# Patient Record
Sex: Female | Born: 1995 | Race: White | Hispanic: No | Marital: Single | Smoking: Former smoker
Health system: Southern US, Community
[De-identification: ages and names within clinical notes are randomized; demographics above are authoritative.]

## PROBLEM LIST (undated history)

## (undated) DIAGNOSIS — Z973 Presence of spectacles and contact lenses: Secondary | ICD-10-CM

## (undated) DIAGNOSIS — L709 Acne, unspecified: Secondary | ICD-10-CM

## (undated) DIAGNOSIS — N803 Endometriosis of pelvic peritoneum, unspecified: Secondary | ICD-10-CM

## (undated) DIAGNOSIS — F329 Major depressive disorder, single episode, unspecified: Secondary | ICD-10-CM

## (undated) DIAGNOSIS — F411 Generalized anxiety disorder: Secondary | ICD-10-CM

## (undated) DIAGNOSIS — N83201 Unspecified ovarian cyst, right side: Secondary | ICD-10-CM

## (undated) DIAGNOSIS — J302 Other seasonal allergic rhinitis: Secondary | ICD-10-CM

## (undated) DIAGNOSIS — F32A Depression, unspecified: Secondary | ICD-10-CM

## (undated) DIAGNOSIS — Z9889 Other specified postprocedural states: Secondary | ICD-10-CM

## (undated) DIAGNOSIS — F419 Anxiety disorder, unspecified: Secondary | ICD-10-CM

## (undated) DIAGNOSIS — F5000 Anorexia nervosa, unspecified: Secondary | ICD-10-CM

## (undated) HISTORY — DX: Major depressive disorder, single episode, unspecified: F32.9

## (undated) HISTORY — DX: Depression, unspecified: F32.A

## (undated) HISTORY — DX: Anxiety disorder, unspecified: F41.9

---
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
## 2015-04-12 ENCOUNTER — Other Ambulatory Visit (HOSPITAL_COMMUNITY): Payer: Self-pay | Admitting: Psychiatry

## 2015-05-10 ENCOUNTER — Other Ambulatory Visit (HOSPITAL_COMMUNITY): Payer: Self-pay | Admitting: Psychiatry

## 2015-06-06 ENCOUNTER — Other Ambulatory Visit (HOSPITAL_COMMUNITY): Payer: Self-pay | Admitting: Psychiatry

## 2016-03-02 ENCOUNTER — Encounter (HOSPITAL_BASED_OUTPATIENT_CLINIC_OR_DEPARTMENT_OTHER): Payer: Self-pay | Admitting: *Deleted

## 2016-03-02 NOTE — H&P (Signed)
Carmen Harris is a 20 y.o. female , originally referred to me by Dr. 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 She has current pelvic pain in the LLQ and clinical impression of endometriosis. History is significant for irregular periods, acne and a history of anorexia with a current BMI of 18 kg/m2. Patient would like to preserve her childbearing potential.  Pertinent Gynecological History: Menses: normal Bleeding: normal Contraception: none DES exposure: denies Blood transfusions: none Sexually transmitted diseases: no past history Previous GYN Procedures: Laparoscopy, Cystectomy  Last mammogram: normal Last pap: normal  OB History: G 0   Menstrual History: Menarche age: 80 No LMP recorded.    Past Medical History  Diagnosis Date  . Anxiety   . Depression   . Acne   . Wears contact lenses                     Past Surgical History  Procedure Laterality Date  . Laparoscopic ovarian cystectomy Bilateral 2014  . Tonsillectomy  2015             Family History  Problem Relation Age of Onset  . Schizophrenia Paternal Uncle    No hereditary disease.  No cancer of breast, ovary, uterus. No cutaneous leiomyomatosis or renal cell carcinoma.  Social History   Social History  . Marital Status: Single    Spouse Name: N/A  . Number of Children: N/A  . Years of Education: N/A   Occupational History  . Not on file.   Social History Main Topics  . Smoking status: Former Smoker -- 1 years    Types: Cigarettes    Quit date: 09/03/2015  . Smokeless tobacco: Never Used  . Alcohol Use: 3.0 oz/week    5 Shots of liquor per week  . Drug Use: No  . Sexual Activity: Yes    Birth Control/ Protection: Condom   Other Topics Concern  . Not on file   Social History Narrative    No Known Allergies  No current facility-administered medications on file prior to encounter.   Current Outpatient Prescriptions on File Prior to  Encounter  Medication Sig Dispense Refill  . buPROPion (WELLBUTRIN XL) 300 MG 24 hr tablet TAKE 1 TABLET EVERY DAY (Patient taking differently: TAKE 1 TABLET EVERY DAY---  takes in am) 30 tablet 0     Review of Systems  Constitutional: Negative.   HENT: Negative.   Eyes: Negative.   Respiratory: Negative.   Cardiovascular: Negative.   Gastrointestinal: Negative.   Genitourinary: Negative.   Musculoskeletal: Negative.   Skin: Negative.   Neurological: Negative.   Endo/Heme/Allergies: Negative.   Psychiatric/Behavioral: Negative.      Physical Exam  Ht 5\' 8"  (1.727 m)  Wt 56.246 kg (124 lb)  BMI 18.86 kg/m2  LMP 02/19/2016 (Exact Date) Constitutional: She is oriented to person, place, and time. She appears well-developed and well-nourished.  HENT:  Head: Normocephalic and atraumatic.  Nose: Nose normal.  Mouth/Throat: Oropharynx is clear and moist. No oropharyngeal exudate.  Eyes: Conjunctivae normal and EOM are normal. Pupils are equal, round, and reactive to light. No scleral icterus.  Neck: Normal range of motion. Neck supple. No tracheal deviation present. No thyromegaly present.  Cardiovascular: Normal rate.   Respiratory: Effort normal and breath sounds normal.  GI: Soft. Bowel sounds are normal. She exhibits no distension and no mass. There is no tenderness.  Lymphadenopathy:    She has no cervical  adenopathy.  Neurological: She is alert and oriented to person, place, and time. She has normal reflexes.  Skin: Skin is warm.  Psychiatric: She has a normal mood and affect. Her behavior is normal. Judgment and thought content normal.       Assessment/Plan:  I recommended laparoscopy, excision of any recurrent endometriosis, and right ovarian dermoid removal.  The adjacent sonolucency, also in the right ovary, appears to be a functional cyst and we would only need to drain this one. I reviewed the benefits and risks of laparoscopic repeat cystectomy.  I reassured her  that her ovaries have a high antral follicle count and that repeat cystectomy would probably not harm her fertility in the future. I propose endometriosis as the real cause of her pelvic pain and the would aim to excise any endometriosis aggressively during the laparoscopy.

## 2016-03-02 NOTE — Progress Notes (Signed)
NPO AFTER MN.  ARRIVE AT 1030.  NEEDS HG AND URINE PREG.  

## 2016-03-03 ENCOUNTER — Ambulatory Visit (HOSPITAL_BASED_OUTPATIENT_CLINIC_OR_DEPARTMENT_OTHER): Payer: BC Managed Care – PPO | Admitting: Certified Registered"

## 2016-03-03 ENCOUNTER — Encounter (HOSPITAL_BASED_OUTPATIENT_CLINIC_OR_DEPARTMENT_OTHER): Payer: Self-pay | Admitting: *Deleted

## 2016-03-03 ENCOUNTER — Ambulatory Visit (HOSPITAL_BASED_OUTPATIENT_CLINIC_OR_DEPARTMENT_OTHER)
Admission: RE | Admit: 2016-03-03 | Discharge: 2016-03-03 | Disposition: A | Discharge status: Home / Self Care | Payer: BC Managed Care – PPO | Source: Ambulatory Visit | Attending: Obstetrics and Gynecology | Admitting: Obstetrics and Gynecology

## 2016-03-03 ENCOUNTER — Encounter (HOSPITAL_BASED_OUTPATIENT_CLINIC_OR_DEPARTMENT_OTHER)
Admission: RE | Disposition: A | Discharge status: Home / Self Care | Payer: Self-pay | Source: Ambulatory Visit | Attending: Obstetrics and Gynecology

## 2016-03-03 DIAGNOSIS — Z87891 Personal history of nicotine dependence: Secondary | ICD-10-CM | POA: Insufficient documentation

## 2016-03-03 DIAGNOSIS — N801 Endometriosis of ovary: Secondary | ICD-10-CM | POA: Insufficient documentation

## 2016-03-03 DIAGNOSIS — N946 Dysmenorrhea, unspecified: Secondary | ICD-10-CM | POA: Diagnosis not present

## 2016-03-03 DIAGNOSIS — D3911 Neoplasm of uncertain behavior of right ovary: Secondary | ICD-10-CM | POA: Insufficient documentation

## 2016-03-03 DIAGNOSIS — D270 Benign neoplasm of right ovary: Secondary | ICD-10-CM | POA: Insufficient documentation

## 2016-03-03 DIAGNOSIS — R102 Pelvic and perineal pain: Secondary | ICD-10-CM | POA: Diagnosis present

## 2016-03-03 DIAGNOSIS — N808 Other endometriosis: Secondary | ICD-10-CM | POA: Insufficient documentation

## 2016-03-03 DIAGNOSIS — N736 Female pelvic peritoneal adhesions (postinfective): Secondary | ICD-10-CM | POA: Insufficient documentation

## 2016-03-03 DIAGNOSIS — F329 Major depressive disorder, single episode, unspecified: Secondary | ICD-10-CM | POA: Insufficient documentation

## 2016-03-03 DIAGNOSIS — N803 Endometriosis of pelvic peritoneum: Secondary | ICD-10-CM | POA: Diagnosis not present

## 2016-03-03 HISTORY — DX: Acne, unspecified: L70.9

## 2016-03-03 HISTORY — DX: Presence of spectacles and contact lenses: Z97.3

## 2016-03-03 HISTORY — PX: LAPAROSCOPIC OVARIAN CYSTECTOMY: SHX6248

## 2016-03-03 LAB — POCT PREGNANCY, URINE: PREG TEST UR: NEGATIVE

## 2016-03-03 LAB — POCT HEMOGLOBIN-HEMACUE: HEMOGLOBIN: 14.9 g/dL (ref 12.0–15.0)

## 2016-03-03 SURGERY — EXCISION, CYST, OVARY, LAPAROSCOPIC
Anesthesia: General | Laterality: Right

## 2016-03-03 MED ORDER — CEFAZOLIN SODIUM-DEXTROSE 2-4 GM/100ML-% IV SOLN
INTRAVENOUS | Status: AC
  Filled 2016-03-03: qty 100

## 2016-03-03 MED ORDER — SCOPOLAMINE 1 MG/3DAYS TD PT72
MEDICATED_PATCH | TRANSDERMAL | Status: AC
  Filled 2016-03-03: qty 1

## 2016-03-03 MED ORDER — OXYCODONE-ACETAMINOPHEN 5-325 MG PO TABS
ORAL_TABLET | ORAL | Status: AC
  Filled 2016-03-03: qty 1

## 2016-03-03 MED ORDER — DEXAMETHASONE SODIUM PHOSPHATE 10 MG/ML IJ SOLN
INTRAMUSCULAR | Status: AC
  Filled 2016-03-03: qty 1

## 2016-03-03 MED ORDER — ROCURONIUM BROMIDE 50 MG/5ML IV SOLN
INTRAVENOUS | Status: AC
  Filled 2016-03-03: qty 1

## 2016-03-03 MED ORDER — GLYCOPYRROLATE 0.2 MG/ML IJ SOLN
INTRAMUSCULAR | Status: AC
  Filled 2016-03-03: qty 1

## 2016-03-03 MED ORDER — NEOSTIGMINE METHYLSULFATE 10 MG/10ML IV SOLN
INTRAVENOUS | Status: DC | PRN
  Administered 2016-03-03: 3 mg via INTRAVENOUS

## 2016-03-03 MED ORDER — LIDOCAINE HCL (CARDIAC) 20 MG/ML IV SOLN
INTRAVENOUS | Status: DC | PRN
  Administered 2016-03-03: 60 mg via INTRAVENOUS

## 2016-03-03 MED ORDER — ACETAMINOPHEN 160 MG/5ML PO SOLN
325.0000 mg | ORAL | Status: DC | PRN
  Filled 2016-03-03: qty 20.3

## 2016-03-03 MED ORDER — NEOSTIGMINE METHYLSULFATE 10 MG/10ML IV SOLN
INTRAVENOUS | Status: AC
  Filled 2016-03-03: qty 1

## 2016-03-03 MED ORDER — OXYCODONE-ACETAMINOPHEN 5-325 MG PO TABS: 1.0000 | ORAL_TABLET | ORAL | Status: DC | PRN

## 2016-03-03 MED ORDER — OXYCODONE HCL 5 MG PO TABS
5.0000 mg | ORAL_TABLET | Freq: Once | ORAL | Status: DC | PRN
  Filled 2016-03-03: qty 1

## 2016-03-03 MED ORDER — ONDANSETRON HCL 4 MG PO TABS: 4.0000 mg | ORAL_TABLET | Freq: Three times a day (TID) | ORAL | Status: DC | PRN

## 2016-03-03 MED ORDER — ONDANSETRON HCL 4 MG/2ML IJ SOLN
INTRAMUSCULAR | Status: DC | PRN
  Administered 2016-03-03: 4 mg via INTRAVENOUS

## 2016-03-03 MED ORDER — FENTANYL CITRATE (PF) 100 MCG/2ML IJ SOLN
INTRAMUSCULAR | Status: DC | PRN
  Administered 2016-03-03 (×5): 50 ug via INTRAVENOUS

## 2016-03-03 MED ORDER — MIDAZOLAM HCL 5 MG/5ML IJ SOLN
INTRAMUSCULAR | Status: DC | PRN
  Administered 2016-03-03: 2 mg via INTRAVENOUS

## 2016-03-03 MED ORDER — MIDAZOLAM HCL 2 MG/2ML IJ SOLN
INTRAMUSCULAR | Status: AC
  Filled 2016-03-03: qty 2

## 2016-03-03 MED ORDER — LIDOCAINE HCL (CARDIAC) 20 MG/ML IV SOLN
INTRAVENOUS | Status: AC
  Filled 2016-03-03: qty 5

## 2016-03-03 MED ORDER — FENTANYL CITRATE (PF) 250 MCG/5ML IJ SOLN
INTRAMUSCULAR | Status: AC
  Filled 2016-03-03: qty 5

## 2016-03-03 MED ORDER — KETOROLAC TROMETHAMINE 30 MG/ML IJ SOLN
INTRAMUSCULAR | Status: AC
  Filled 2016-03-03: qty 1

## 2016-03-03 MED ORDER — KETOROLAC TROMETHAMINE 30 MG/ML IJ SOLN
INTRAMUSCULAR | Status: DC | PRN
  Administered 2016-03-03: 30 mg via INTRAVENOUS

## 2016-03-03 MED ORDER — CEFAZOLIN SODIUM-DEXTROSE 2-4 GM/100ML-% IV SOLN
2.0000 g | INTRAVENOUS | Status: AC
  Administered 2016-03-03: 2 g via INTRAVENOUS
  Filled 2016-03-03: qty 100

## 2016-03-03 MED ORDER — ONDANSETRON HCL 4 MG/2ML IJ SOLN
INTRAMUSCULAR | Status: AC
  Filled 2016-03-03: qty 2

## 2016-03-03 MED ORDER — PROPOFOL 10 MG/ML IV BOLUS
INTRAVENOUS | Status: DC | PRN
  Administered 2016-03-03: 200 mg via INTRAVENOUS

## 2016-03-03 MED ORDER — OXYCODONE-ACETAMINOPHEN 5-325 MG PO TABS
1.0000 | ORAL_TABLET | ORAL | Status: DC | PRN
  Administered 2016-03-03: 1 via ORAL
  Filled 2016-03-03: qty 1

## 2016-03-03 MED ORDER — ROCURONIUM BROMIDE 100 MG/10ML IV SOLN
INTRAVENOUS | Status: DC | PRN
  Administered 2016-03-03 (×2): 10 mg via INTRAVENOUS
  Administered 2016-03-03: 40 mg via INTRAVENOUS
  Administered 2016-03-03: 10 mg via INTRAVENOUS

## 2016-03-03 MED ORDER — DEXAMETHASONE SODIUM PHOSPHATE 4 MG/ML IJ SOLN
INTRAMUSCULAR | Status: DC | PRN
  Administered 2016-03-03: 10 mg via INTRAVENOUS

## 2016-03-03 MED ORDER — GLYCOPYRROLATE 0.2 MG/ML IJ SOLN
INTRAMUSCULAR | Status: DC | PRN
  Administered 2016-03-03: 0.4 mg via INTRAVENOUS

## 2016-03-03 MED ORDER — ACETAMINOPHEN 325 MG PO TABS
325.0000 mg | ORAL_TABLET | ORAL | Status: DC | PRN
  Filled 2016-03-03: qty 2

## 2016-03-03 MED ORDER — FENTANYL CITRATE (PF) 100 MCG/2ML IJ SOLN
INTRAMUSCULAR | Status: AC
  Filled 2016-03-03: qty 2

## 2016-03-03 MED ORDER — ACETAMINOPHEN 10 MG/ML IV SOLN
INTRAVENOUS | Status: AC
  Filled 2016-03-03: qty 100

## 2016-03-03 MED ORDER — LACTATED RINGERS IV SOLN
INTRAVENOUS | Status: DC
  Administered 2016-03-03 (×3): via INTRAVENOUS
  Filled 2016-03-03: qty 1000

## 2016-03-03 MED ORDER — LACTATED RINGERS IR SOLN
Status: DC | PRN
  Administered 2016-03-03: 3000 mL

## 2016-03-03 MED ORDER — FENTANYL CITRATE (PF) 100 MCG/2ML IJ SOLN
25.0000 ug | INTRAMUSCULAR | Status: DC | PRN
  Administered 2016-03-03 (×2): 25 ug via INTRAVENOUS
  Filled 2016-03-03: qty 1

## 2016-03-03 MED ORDER — BUPIVACAINE-EPINEPHRINE 0.5% -1:200000 IJ SOLN
INTRAMUSCULAR | Status: DC | PRN
  Administered 2016-03-03: 5 mL

## 2016-03-03 MED ORDER — PROPOFOL 10 MG/ML IV BOLUS
INTRAVENOUS | Status: AC
  Filled 2016-03-03: qty 40

## 2016-03-03 MED ORDER — ONDANSETRON HCL 4 MG PO TABS
4.0000 mg | ORAL_TABLET | Freq: Once | ORAL | Status: DC
  Filled 2016-03-03: qty 1

## 2016-03-03 MED ORDER — SCOPOLAMINE 1 MG/3DAYS TD PT72
1.0000 | MEDICATED_PATCH | TRANSDERMAL | Status: DC
  Administered 2016-03-03: 1.5 mg via TRANSDERMAL
  Filled 2016-03-03: qty 1

## 2016-03-03 MED ORDER — OXYCODONE HCL 5 MG/5ML PO SOLN
5.0000 mg | Freq: Once | ORAL | Status: DC | PRN
  Filled 2016-03-03: qty 5

## 2016-03-03 SURGICAL SUPPLY — 72 items
APPLICATOR COTTON TIP 6IN STRL (MISCELLANEOUS) ×3 IMPLANT
BAG SPEC RTRVL LRG 6X4 10 (ENDOMECHANICALS)
BARRIER ADHS 3X4 INTERCEED (GAUZE/BANDAGES/DRESSINGS) ×2 IMPLANT
BLADE SURG 11 STRL SS (BLADE) ×3 IMPLANT
BRR ADH 4X3 ABS CNTRL BYND (GAUZE/BANDAGES/DRESSINGS) ×1
BRR ADH 6X5 SEPRAFILM 1 SHT (MISCELLANEOUS)
CATH ROBINSON RED A/P 16FR (CATHETERS) IMPLANT
COVER MAYO STAND STRL (DRAPES) ×3 IMPLANT
DEVICE TROCAR PUNCTURE CLOSURE (ENDOMECHANICALS) IMPLANT
DRAPE UNDERBUTTOCKS STRL (DRAPE) ×3 IMPLANT
DRSG OPSITE POSTOP 3X4 (GAUZE/BANDAGES/DRESSINGS) IMPLANT
ELECT NDL TIP 2.8 STRL (NEEDLE) IMPLANT
ELECT NEEDLE TIP 2.8 STRL (NEEDLE) IMPLANT
ELECT REM PT RETURN 9FT ADLT (ELECTROSURGICAL) ×3
ELECTRODE REM PT RTRN 9FT ADLT (ELECTROSURGICAL) ×1 IMPLANT
FILTER SMOKE EVAC LAPAROSHD (FILTER) ×3 IMPLANT
GLOVE BIOGEL PI IND STRL 8.5 (GLOVE) ×1 IMPLANT
GLOVE BIOGEL PI INDICATOR 8.5 (GLOVE) ×2
GOWN STRL REUS W/ TWL LRG LVL3 (GOWN DISPOSABLE) ×2 IMPLANT
GOWN STRL REUS W/TWL LRG LVL3 (GOWN DISPOSABLE) ×6
HOLDER FOLEY CATH W/STRAP (MISCELLANEOUS) IMPLANT
KIT ROOM TURNOVER WOR (KITS) ×3 IMPLANT
LIQUID BAND (GAUZE/BANDAGES/DRESSINGS) ×2 IMPLANT
MANIPULATOR UTERINE 4.5 ZUMI (MISCELLANEOUS) ×3 IMPLANT
NDL HYPO 25X1 1.5 SAFETY (NEEDLE) ×1 IMPLANT
NDL INSUFFLATION 14GA 120MM (NEEDLE) ×1 IMPLANT
NDL SAFETY ECLIPSE 18X1.5 (NEEDLE) ×1 IMPLANT
NEEDLE HYPO 18GX1.5 SHARP (NEEDLE) ×3
NEEDLE HYPO 25X1 1.5 SAFETY (NEEDLE) ×3 IMPLANT
NEEDLE INSUFFLATION 14GA 120MM (NEEDLE) ×3 IMPLANT
NS IRRIG 500ML POUR BTL (IV SOLUTION) ×3 IMPLANT
PACK BASIN DAY SURGERY FS (CUSTOM PROCEDURE TRAY) ×3 IMPLANT
PACK LAPAROSCOPY II (CUSTOM PROCEDURE TRAY) ×3 IMPLANT
PAD OB MATERNITY 4.3X12.25 (PERSONAL CARE ITEMS) ×3 IMPLANT
PADDING ION DISPOSABLE (MISCELLANEOUS) ×3 IMPLANT
PENCIL BUTTON HOLSTER BLD 10FT (ELECTRODE) IMPLANT
POUCH SPECIMEN RETRIEVAL 10MM (ENDOMECHANICALS) IMPLANT
SCALPEL HARMONIC ACE (MISCELLANEOUS) IMPLANT
SEALER TISSUE G2 CVD JAW 35 (ENDOMECHANICALS) IMPLANT
SEALER TISSUE G2 CVD JAW 45CM (ENDOMECHANICALS) IMPLANT
SEPRAFILM MEMBRANE 5X6 (MISCELLANEOUS) IMPLANT
SET IRRIG TUBING LAPAROSCOPIC (IRRIGATION / IRRIGATOR) ×3 IMPLANT
SOLUTION ANTI FOG 6CC (MISCELLANEOUS) ×3 IMPLANT
SUT MNCRL AB 4-0 PS2 18 (SUTURE) ×3 IMPLANT
SUT PROLENE 0 CT 1 30 (SUTURE) IMPLANT
SUT VIC AB 2-0 CT1 27 (SUTURE)
SUT VIC AB 2-0 CT1 TAPERPNT 27 (SUTURE) IMPLANT
SUT VIC AB 2-0 CT2 27 (SUTURE) IMPLANT
SUT VIC AB 2-0 UR6 27 (SUTURE) ×2 IMPLANT
SUT VIC AB 4-0 SH 27 (SUTURE)
SUT VIC AB 4-0 SH 27XANBCTRL (SUTURE) IMPLANT
SUT VICRYL 0 TIES 12 18 (SUTURE) IMPLANT
SYR 20CC LL (SYRINGE) IMPLANT
SYR 30ML LL (SYRINGE) IMPLANT
SYR 3ML 23GX1 SAFETY (SYRINGE) IMPLANT
SYR 5ML LL (SYRINGE) ×3 IMPLANT
SYR CONTROL 10ML LL (SYRINGE) ×3 IMPLANT
SYRINGE 10CC LL (SYRINGE) ×3 IMPLANT
SYS LAPSCP GELPORT 120MM (MISCELLANEOUS)
SYS RETRIEVAL 5MM INZII UNIV (BASKET) ×3
SYSTEM LAPSCP GELPORT 120MM (MISCELLANEOUS) IMPLANT
SYSTEM RETRIEVL 5MM INZII UNIV (BASKET) IMPLANT
TOWEL OR 17X24 6PK STRL BLUE (TOWEL DISPOSABLE) ×6 IMPLANT
TRAY DSU PREP LF (CUSTOM PROCEDURE TRAY) ×3 IMPLANT
TRAY FOLEY CATH SILVER 14FR (SET/KITS/TRAYS/PACK) ×3 IMPLANT
TROCAR OPTI TIP 5M 100M (ENDOMECHANICALS) ×3 IMPLANT
TROCAR XCEL DIL TIP R 11M (ENDOMECHANICALS) IMPLANT
TUBE CONNECTING 12'X1/4 (SUCTIONS)
TUBE CONNECTING 12X1/4 (SUCTIONS) IMPLANT
TUBING INSUFFLATION 10FT LAP (TUBING) IMPLANT
WARMER LAPAROSCOPE (MISCELLANEOUS) ×3 IMPLANT
WATER STERILE IRR 500ML POUR (IV SOLUTION) ×3 IMPLANT

## 2016-03-03 NOTE — Discharge Instructions (Signed)

## 2016-03-03 NOTE — Transfer of Care (Signed)
Immediate Anesthesia Transfer of Care Note  Patient: Carmen Harris  Procedure(s) Performed: Procedure(s) (LRB): LAPAROSCOPIC RIGHT OVARIAN CYSTECTOMY (Right)  Patient Location: PACU  Anesthesia Type: General  Level of Consciousness: awake, oriented, sedated and patient cooperative  Airway & Oxygen Therapy: Patient Spontanous Breathing and Patient connected to face mask oxygen  Post-op Assessment: Report given to PACU RN and Post -op Vital signs reviewed and stable  Post vital signs: Reviewed and stable  Complications: No apparent anesthesia complications

## 2016-03-03 NOTE — Addendum Note (Signed)
Addendum  created 03/03/16 2051 by Lillia Abed, MD   Modules edited: Clinical Notes   Clinical Notes:  File: [id number]

## 2016-03-03 NOTE — Op Note (Signed)
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 endotracheal  Complications: None  Estimated blood loss: <10 cc  Specimens: Right ovarian cyst #1, right ovarian cyst #2, posterior cul-de-sac peritoneal lesions, left ureteral peritoneal lesion  Findings: On laparoscopy, upper abdomen, liver surface and diaphragm surfaces were normal. Gallbladder was normal. The appendix was grossly normal. The pelvic peritoneum looked mostly normal. Posterior cul-de-sac contained stellate fibrotic lesions. 2 lesions of stellate fibrosis were also seen in the right broad ligament and was ablated. Left ovary was less than 30% adherent with dense adhesions, the base of which contained endometriosis between the left ovary and the uterosacral ligament. The ovary also appeared post cystectomy. The left fallopian tube was grossly normal with fimbria 4 out of 5.  The right fallopian tube was grossly normal with 5 out of 5 fimbria. The right ovary was enlarged with 2 cysts: A 4 x 3 cm dermoid at its superior pole and a 3 x 3 cm serous cyst at the inferior pole.   Description of the procedure: The patient was placed in dorsal supine position and general endotracheal anesthesia was given. Antibiotics were given intravenously for prophylaxis. Patient was placed in lithotomy position. She was prepped and draped inside manner. The bladder was catheterized with a Foley. A ZUMI catheter was placed into the uterine cavity. Uterus sounded to 9 cm. The surgeon was regloved and a surgical field was created on the  abdomen.  After preemptive anesthesia of all surgical sites with 0.25% bupivacaine with 1 200,000 epinephrine, a 5 mm intraumbilical skin incision was made and a Verress needle was inserted. Its correct location was confirmed. A pneumoperitoneum was created with carbon dioxide.  5 mm laparoscope with a 30 lens was inserted and video laparoscopy was started . A left lower quadrant 5 mm and a right lower quadrant  5 mm incisions (the latter was later extended to 1.2 cm in order to remove the Endo Catch bag with the dermoid cyst in it) were made and ancillary trochars were placed under direct visualization. Above findings were noted.   Using a needle electrode on 20 W cutting, the ovarian cortex overlying the 2 cysts was carefully incised. The ovarian cortex was separated from the underlying cysts by sharp scissor and blunt dissection. At the completion of the dissection of the serous cyst the cyst was ruptured. The cyst wall was removed completely and submitted to pathology. Then be worked on the 4 x 3 cm right ovarian dermoid. When the cyst was nearly completely dissected the cyst ruptured and the contents were immediately suctioned. The cyst wall was placed in an Endo Catch bag and removed through the right lower quadrant incision which had to be extended to 1.2 cm.  The stellate fibrotic lesions in the posterior cul-de-sac and in the right broad ligament were excised and ablated with needle electrode. The left ovary was freed up from its adhesion to the uterosacral ligament and the dark bloody discharge indicated endometriosis. The aspect of the adhesion on the left ovary was ablated and the peritoneum on the uterosacral ligament around the adhesion was excised in a discoid manner and submitted to pathology.  Good hemostasis was obtained. The pelvis  was copiously irrigated and aspirated until the keratinaceous right ovarian cyst contents were no longer noted in the aspiration fluid.  We wrapped the right  ovary status post cystectomy in a sheet of Interceed as an adhesion barrier. The procedure was terminated. A 0 Vicryl figure-of-eight suture was placed on the right lower abdominal port at the fascia layer. The skin  incisions were approximated with Dermabond.   The patient tolerated the procedure well and was transferred to recovery room in satisfactory condition.

## 2016-03-03 NOTE — Anesthesia Postprocedure Evaluation (Signed)
Anesthesia Post Note  Patient: Carmen Harris  Procedure(s) Performed: Procedure(s) (LRB): LAPAROSCOPIC RIGHT OVARIAN CYSTECTOMY (Right)  Patient location during evaluation: PACU Anesthesia Type: General Level of consciousness: awake and alert Pain management: pain level controlled Vital Signs Assessment: post-procedure vital signs reviewed and stable Respiratory status: spontaneous breathing, nonlabored ventilation, respiratory function stable and patient connected to nasal cannula oxygen Cardiovascular status: blood pressure returned to baseline and stable Postop Assessment: no signs of nausea or vomiting Anesthetic complications: no    Last Vitals:  Filed Vitals:   03/03/16 1700 03/03/16 1715  BP: 124/76 139/83  Pulse: 72 89  Temp:    Resp: 16 19    Last Pain:  Filed Vitals:   03/03/16 1718  PainSc: 4                  Mallorey Odonell DAVID

## 2016-03-03 NOTE — Anesthesia Procedure Notes (Signed)
Procedure Name: Intubation Date/Time: 03/03/2016 1:02 PM Performed by: Denna Haggard D Pre-anesthesia Checklist: Patient identified, Emergency Drugs available, Suction available and Patient being monitored Patient Re-evaluated:Patient Re-evaluated prior to inductionOxygen Delivery Method: Circle System Utilized Preoxygenation: Pre-oxygenation with 100% oxygen Intubation Type: IV induction Ventilation: Mask ventilation without difficulty Laryngoscope Size: Mac and 3 Grade View: Grade I Tube type: Oral Tube size: 7.0 mm Number of attempts: 1 Airway Equipment and Method: Stylet and Oral airway Placement Confirmation: ETT inserted through vocal cords under direct vision,  positive ETCO2 and breath sounds checked- equal and bilateral Secured at: 22 cm Tube secured with: Tape Dental Injury: Teeth and Oropharynx as per pre-operative assessment

## 2016-03-03 NOTE — Anesthesia Preprocedure Evaluation (Addendum)
Anesthesia Evaluation  Patient identified by MRN, date of birth, ID band Patient awake    Reviewed: Allergy & Precautions, NPO status , Patient's Chart, lab work & pertinent test results  History of Anesthesia Complications (+) PONV and history of anesthetic complications  Airway Mallampati: I  TM Distance: >3 FB Neck ROM: Full    Dental  (+) Teeth Intact, Dental Advisory Given,    Pulmonary neg shortness of breath, neg sleep apnea, neg COPD, neg recent URI, former smoker,    Pulmonary exam normal breath sounds clear to auscultation       Cardiovascular Exercise Tolerance: Good negative cardio ROS Normal cardiovascular exam Rhythm:Regular     Neuro/Psych PSYCHIATRIC DISORDERS Anxiety Depression negative neurological ROS     GI/Hepatic negative GI ROS, Neg liver ROS,   Endo/Other  negative endocrine ROS  Renal/GU negative Renal ROS     Musculoskeletal negative musculoskeletal ROS (+)   Abdominal   Peds  Hematology negative hematology ROS (+)   Anesthesia Other Findings   Reproductive/Obstetrics Dermoid cyst                         Anesthesia Physical Anesthesia Plan  ASA: II  Anesthesia Plan: General   Post-op Pain Management:    Induction: Intravenous  Airway Management Planned: Oral ETT  Additional Equipment: None  Intra-op Plan:   Post-operative Plan: Extubation in OR  Informed Consent: I have reviewed the patients History and Physical, chart, labs and discussed the procedure including the risks, benefits and alternatives for the proposed anesthesia with the patient or authorized representative who has indicated his/her understanding and acceptance.   Dental advisory given  Plan Discussed with: CRNA and Surgeon  Anesthesia Plan Comments:         Anesthesia Quick Evaluation

## 2016-03-04 ENCOUNTER — Encounter (HOSPITAL_BASED_OUTPATIENT_CLINIC_OR_DEPARTMENT_OTHER): Payer: Self-pay | Admitting: Obstetrics and Gynecology

## 2019-11-03 ENCOUNTER — Other Ambulatory Visit: Payer: Self-pay | Admitting: Obstetrics and Gynecology

## 2019-11-03 DIAGNOSIS — N83299 Other ovarian cyst, unspecified side: Secondary | ICD-10-CM

## 2019-11-22 ENCOUNTER — Other Ambulatory Visit: Payer: Self-pay | Admitting: Obstetrics and Gynecology

## 2019-11-27 ENCOUNTER — Ambulatory Visit
Admission: RE | Admit: 2019-11-27 | Discharge: 2019-11-27 | Disposition: A | Discharge status: Home / Self Care | Payer: BC Managed Care – PPO | Source: Ambulatory Visit | Attending: Obstetrics and Gynecology | Admitting: Obstetrics and Gynecology

## 2019-11-27 DIAGNOSIS — N83299 Other ovarian cyst, unspecified side: Secondary | ICD-10-CM

## 2019-11-27 MED ORDER — GADOBENATE DIMEGLUMINE 529 MG/ML IV SOLN
14.0000 mL | Freq: Once | INTRAVENOUS | Status: AC | PRN
  Administered 2019-11-27: 14 mL via INTRAVENOUS

## 2020-01-18 ENCOUNTER — Ambulatory Visit: Payer: BC Managed Care – PPO | Attending: Internal Medicine

## 2020-01-18 DIAGNOSIS — Z23 Encounter for immunization: Secondary | ICD-10-CM

## 2020-01-18 NOTE — Progress Notes (Signed)
   Covid-19 Vaccination Clinic  Name:  DEZRA SOTOLONGO    MRN: [id number] DOB: Oct 14, 1995  01/18/2020  Ms. Canupp was observed post Covid-19 immunization for 15 minutes without incident. She was provided with Vaccine Information Sheet and instruction to access the V-Safe system.   Ms. Pala was instructed to call 911 with any severe reactions post vaccine: Marland Kitchen Difficulty breathing  . Swelling of face and throat  . A fast heartbeat  . A bad rash all over body  . Dizziness and weakness   Immunizations Administered    Name Date Dose VIS Date Route   Pfizer COVID-19 Vaccine 01/18/2020  9:55 AM 0.3 mL 09/22/2019 Intramuscular   Manufacturer: [place]   Lot: [id number]   [place]: [id number]

## 2020-02-12 ENCOUNTER — Ambulatory Visit: Payer: BC Managed Care – PPO

## 2020-02-13 ENCOUNTER — Ambulatory Visit: Payer: BC Managed Care – PPO

## 2020-02-17 ENCOUNTER — Ambulatory Visit: Payer: BC Managed Care – PPO

## 2020-02-19 ENCOUNTER — Ambulatory Visit: Payer: BC Managed Care – PPO | Attending: Internal Medicine

## 2020-02-19 DIAGNOSIS — Z23 Encounter for immunization: Secondary | ICD-10-CM

## 2020-02-19 NOTE — Progress Notes (Signed)
   Covid-19 Vaccination Clinic  Name:  Carmen Harris    MRN: [id number] DOB: 11-Apr-1996  02/19/2020  Ms. Carmen Harris was observed post Covid-19 immunization for 15 minutes without incident. She was provided with Vaccine Information Sheet and instruction to access the V-Safe system.   Ms. Carmen Harris was instructed to call 911 with any severe reactions post vaccine: Marland Kitchen Difficulty breathing  . Swelling of face and throat  . A fast heartbeat  . A bad rash all over body  . Dizziness and weakness   Immunizations Administered    Name Date Dose VIS Date Route   Pfizer COVID-19 Vaccine 02/19/2020  4:18 PM 0.3 mL 12/06/2018 Intramuscular   Manufacturer: [place]   Lot: [id number]   [place]: [id number]

## 2020-10-13 IMAGING — MR MR PELVIS WO/W CM
9 of 11 series · 35 of 48 positions shown · IV contrast (14ml Multihance)
Comparison: Pelvic ultrasound dated 03/23/2013

CLINICAL DATA: Complex left pelvic cyst

EXAM:
MRI PELVIS WITHOUT AND WITH CONTRAST
TECHNIQUE: Multiplanar multisequence MR imaging of the pelvis was performed
both before and after administration of intravenous contrast.
CONTRAST:  14mL MULTIHANCE GADOBENATE DIMEGLUMINE 529 MG/ML IV SOLN

[Series 2: T2 · coronal · 5.0mm · 1.25mm/px · 1 of 5 slices shown (1 of 4)]
[im 1/5]
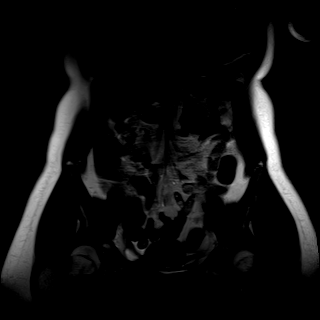

[Series 3: T2 · coronal · 5.0mm · 1.25mm/px · 2 of 30 slices shown (2 of 4)]
[im 1/30]
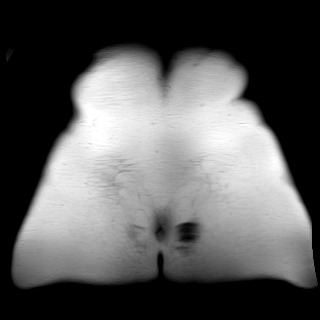
[im 30/30]
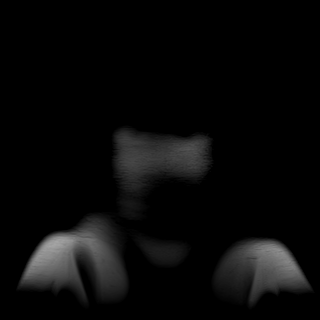

[Series 4: T2 · sagittal · 5.0mm · 0.78mm/px · 1 of 28 slices shown (3 of 4)]
[im 1/28]
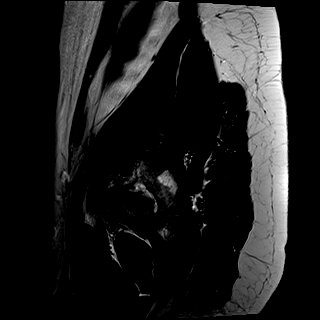

[Series 5: T2 · axial · 5.0mm · 0.41mm/px · z∈[-183,+21]mm · 2 of 35 slices shown (4 of 4)]
[im 1/35]
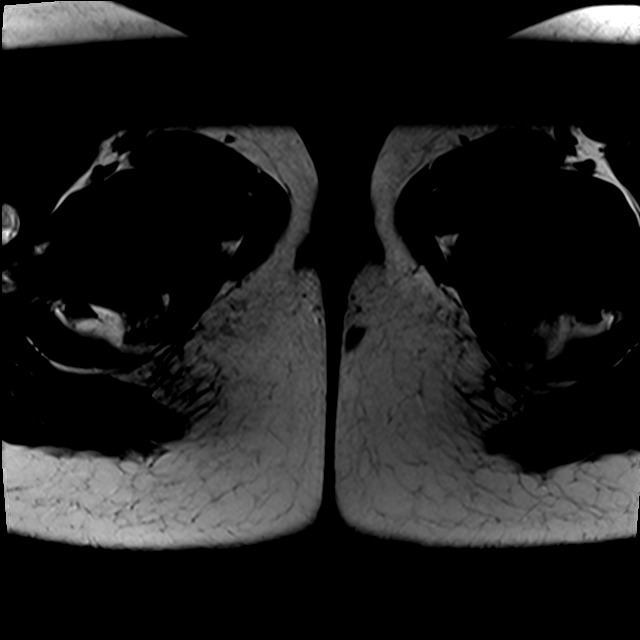
[im 35/35]
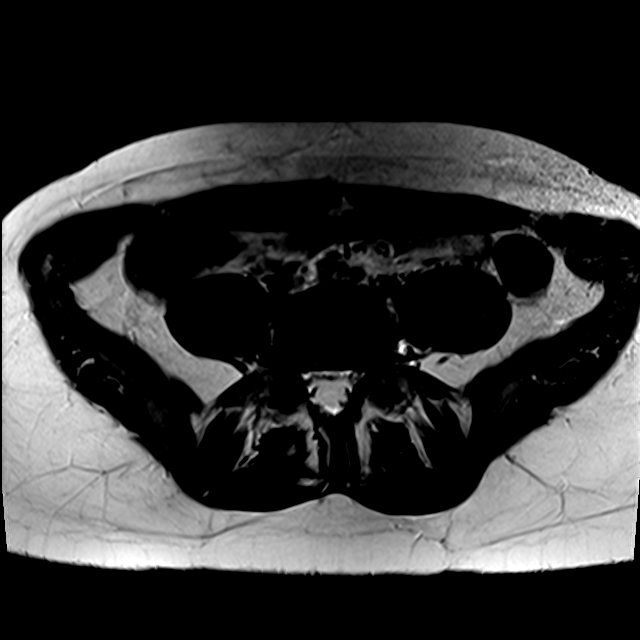

[Series 6: T2 fat-sat · axial · 5.0mm · 0.41mm/px · z∈[-183,+21]mm · 2 of 35 slices shown]
[im 1/35]
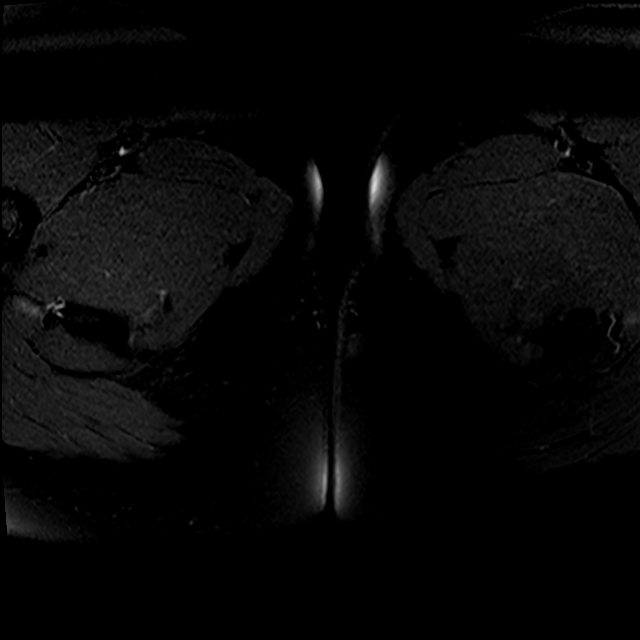
[im 35/35]
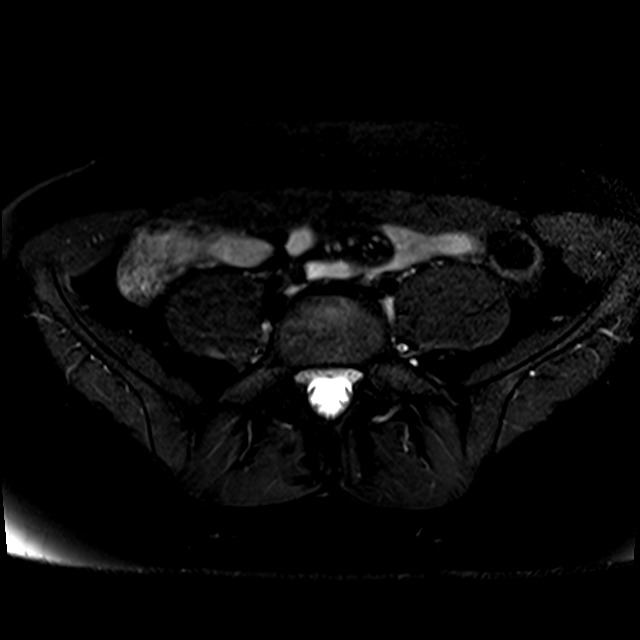

[Series 7: T1 · axial · 1.2mm · 0.75mm/px · z∈[-171,+20]mm · 8 of 160 slices shown]
[im 1/160]
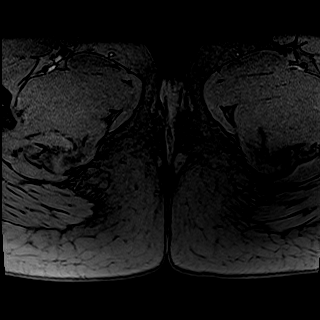
[im 23/160]
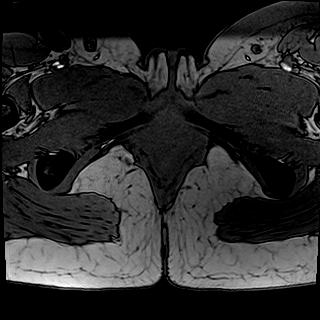
[im 46/160]
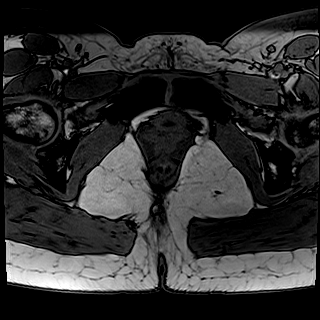
[im 69/160]
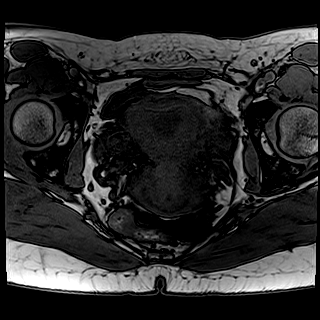
[im 91/160]
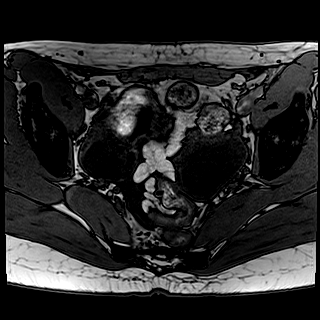
[im 114/160]
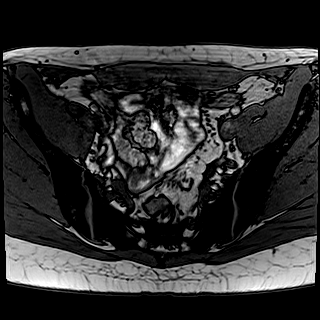
[im 137/160]
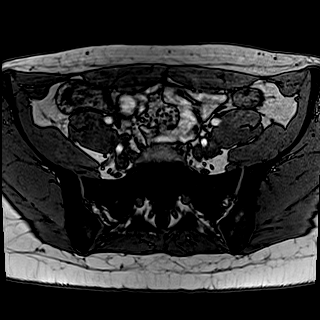
[im 160/160]
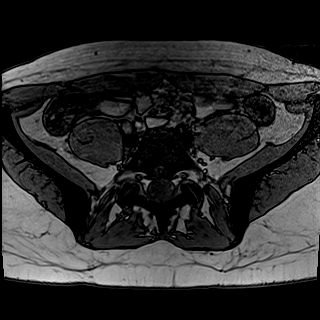

[Series 8: T1 fat-sat · axial · 1.2mm · 0.75mm/px · z∈[-177,+13]mm · 8 of 160 slices shown]
[im 1/160]
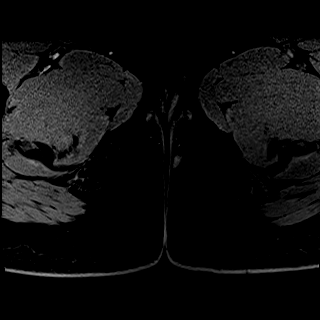
[im 23/160]
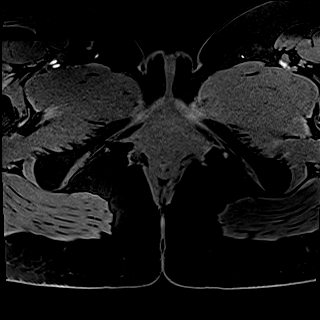
[im 46/160]
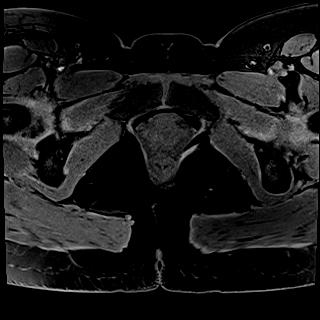
[im 69/160]
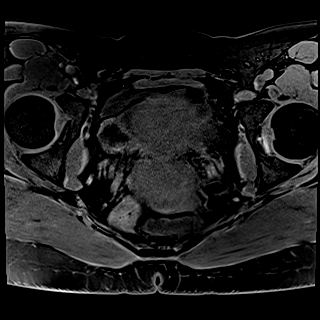
[im 91/160]
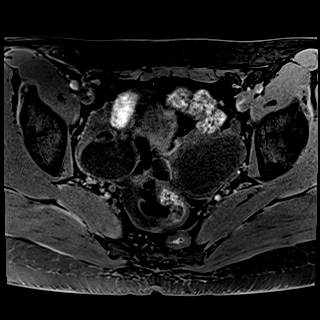
[im 114/160]
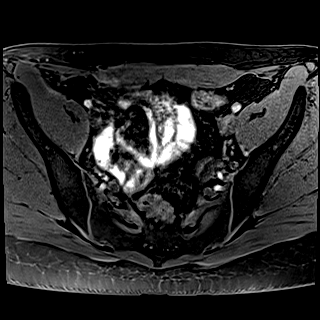
[im 137/160]
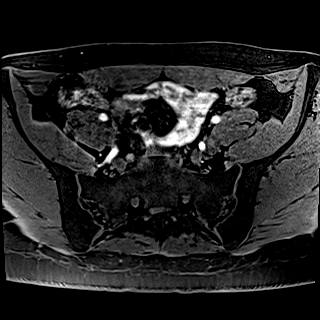
[im 160/160]
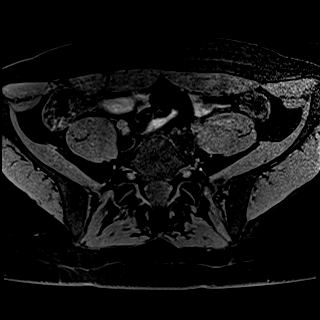

[Series 9: T1 fat-sat post-contrast · axial · 1.2mm · 0.75mm/px · z∈[-177,+13]mm · 8 of 160 slices shown (1 of 2)]
[im 1/160]
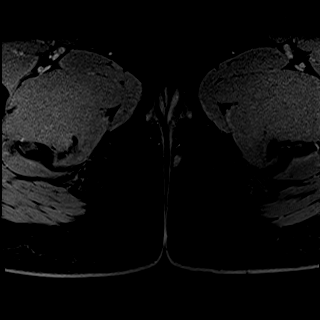
[im 23/160]
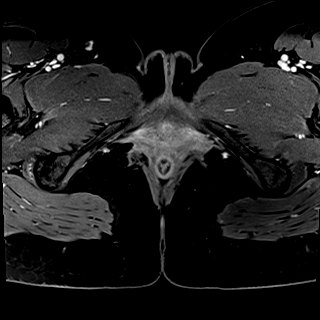
[im 46/160]
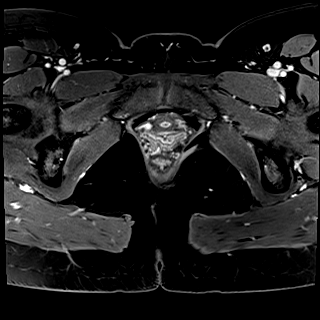
[im 69/160]
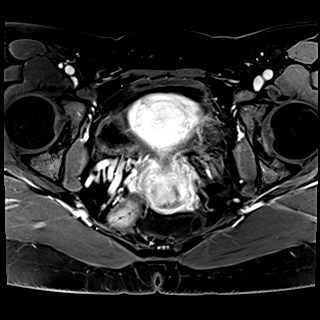
[im 91/160]
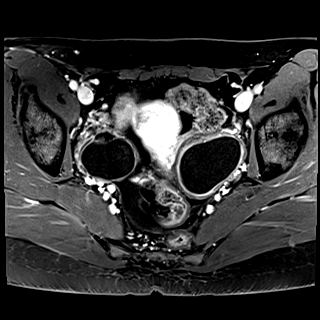
[im 114/160]
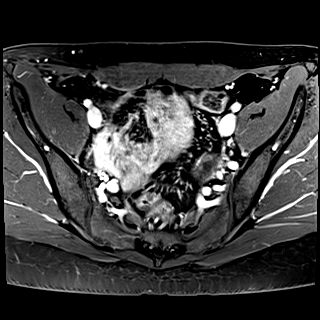
[im 137/160]
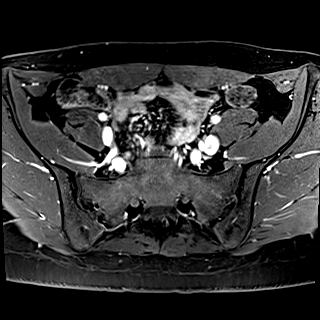
[im 160/160]
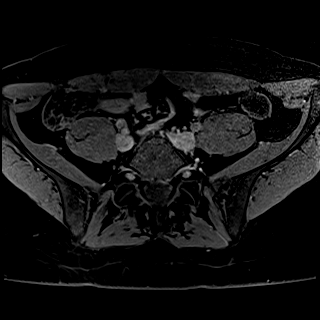

[Series 10: T1 fat-sat post-contrast · axial · 1.2mm · 0.75mm/px · z∈[-177,-123]mm · 3 of 160 slices shown (2 of 2)]
[im 1/160]
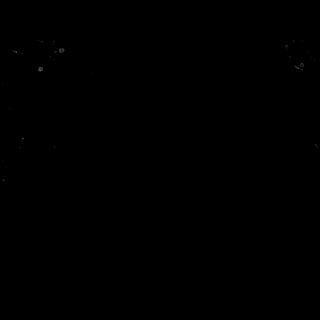
[im 23/160]
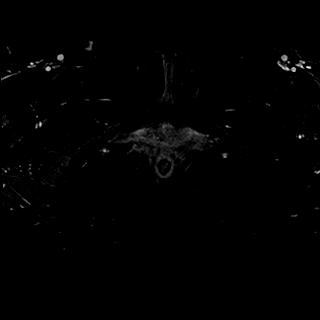
[im 46/160]
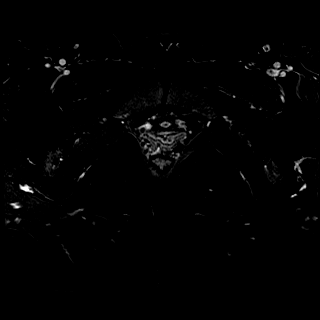

[35 of 48 positions shown; findings below may reference images not displayed]

FINDINGS: Urinary Tract:  Bladder is underdistended but unremarkable.

Bowel:  Visualized bowel is unremarkable.

Vascular/Lymphatic: No evidence of aneurysm.

No suspicious pelvic lymphadenopathy.

Reproductive:  Uterus is within normal limits.

Right ovary is notable for a 3.8 x 4.4 x 3.7 cm complex cystic
lesion with 8 x 15 mm nonenhancing mural nodule anteriorly (series
5/image 17), hemorrhage along the lateral margin (series 8/image
82), and a mildly thickened rim but otherwise without enhancement
following contrast administration. No overt malignant features. This
favors a benign hemorrhagic cyst.

Left ovary is notable for a 3.8 x 5.7 x 4.5 cm minimally complex
cystic lesion with mild layering debris and with a mildly thickened
rim, but without solid component or enhancement following contrast
administration. No malignant features. This favors a minimally
complex physiologic cyst.

Other:  Small volume pelvic ascites.

Musculoskeletal: No focal osseous lesions.
IMPRESSION: Bilateral ovarian cysts, measuring 5.7 cm on the left and 4.4 cm on
the right, minimally complex without overt malignant features.

Follow-up pelvic ultrasound is suggested in 6-10 weeks to assess for
interval change.

## 2020-10-24 ENCOUNTER — Other Ambulatory Visit: Payer: Self-pay | Admitting: Obstetrics and Gynecology

## 2020-10-24 DIAGNOSIS — N83209 Unspecified ovarian cyst, unspecified side: Secondary | ICD-10-CM

## 2020-11-01 ENCOUNTER — Encounter (HOSPITAL_BASED_OUTPATIENT_CLINIC_OR_DEPARTMENT_OTHER): Payer: Self-pay | Admitting: *Deleted

## 2020-11-01 ENCOUNTER — Emergency Department (HOSPITAL_BASED_OUTPATIENT_CLINIC_OR_DEPARTMENT_OTHER): Payer: Self-pay

## 2020-11-01 ENCOUNTER — Emergency Department (HOSPITAL_BASED_OUTPATIENT_CLINIC_OR_DEPARTMENT_OTHER)
Admission: EM | Admit: 2020-11-01 | Discharge: 2020-11-01 | Disposition: A | Discharge status: Home / Self Care | Payer: Self-pay | Attending: Emergency Medicine | Admitting: Emergency Medicine

## 2020-11-01 ENCOUNTER — Other Ambulatory Visit: Payer: Self-pay

## 2020-11-01 DIAGNOSIS — R102 Pelvic and perineal pain: Secondary | ICD-10-CM | POA: Insufficient documentation

## 2020-11-01 DIAGNOSIS — R1031 Right lower quadrant pain: Secondary | ICD-10-CM

## 2020-11-01 DIAGNOSIS — M545 Low back pain, unspecified: Secondary | ICD-10-CM | POA: Insufficient documentation

## 2020-11-01 DIAGNOSIS — Z87891 Personal history of nicotine dependence: Secondary | ICD-10-CM | POA: Insufficient documentation

## 2020-11-01 LAB — URINALYSIS, ROUTINE W REFLEX MICROSCOPIC
Bilirubin Urine: NEGATIVE
Glucose, UA: NEGATIVE mg/dL
Hgb urine dipstick: NEGATIVE
Ketones, ur: NEGATIVE mg/dL
Leukocytes,Ua: NEGATIVE
Nitrite: NEGATIVE
Protein, ur: NEGATIVE mg/dL
Specific Gravity, Urine: 1.03 (ref 1.005–1.030)
pH: 6.5 (ref 5.0–8.0)

## 2020-11-01 LAB — CBC WITH DIFFERENTIAL/PLATELET
Abs Immature Granulocytes: 0.02 10*3/uL (ref 0.00–0.07)
Basophils Absolute: 0 10*3/uL (ref 0.0–0.1)
Basophils Relative: 1 %
Eosinophils Absolute: 0.1 10*3/uL (ref 0.0–0.5)
Eosinophils Relative: 2 %
HCT: 42.8 % (ref 36.0–46.0)
Hemoglobin: 14.5 g/dL (ref 12.0–15.0)
Immature Granulocytes: 0 %
Lymphocytes Relative: 32 %
Lymphs Abs: 2.4 10*3/uL (ref 0.7–4.0)
MCH: 32 pg (ref 26.0–34.0)
MCHC: 33.9 g/dL (ref 30.0–36.0)
MCV: 94.5 fL (ref 80.0–100.0)
Monocytes Absolute: 0.7 10*3/uL (ref 0.1–1.0)
Monocytes Relative: 10 %
Neutro Abs: 4.2 10*3/uL (ref 1.7–7.7)
Neutrophils Relative %: 55 %
Platelets: 268 10*3/uL (ref 150–400)
RBC: 4.53 MIL/uL (ref 3.87–5.11)
RDW: 12.2 % (ref 11.5–15.5)
WBC: 7.5 10*3/uL (ref 4.0–10.5)
nRBC: 0 % (ref 0.0–0.2)

## 2020-11-01 LAB — COMPREHENSIVE METABOLIC PANEL
ALT: 13 U/L (ref 0–44)
AST: 20 U/L (ref 15–41)
Albumin: 4.6 g/dL (ref 3.5–5.0)
Alkaline Phosphatase: 43 U/L (ref 38–126)
Anion gap: 9 (ref 5–15)
BUN: 12 mg/dL (ref 6–20)
CO2: 26 mmol/L (ref 22–32)
Calcium: 9.5 mg/dL (ref 8.9–10.3)
Chloride: 100 mmol/L (ref 98–111)
Creatinine, Ser: 0.73 mg/dL (ref 0.44–1.00)
GFR, Estimated: >60 mL/min (ref >60)
Glucose, Bld: 76 mg/dL (ref 70–99)
Potassium: 3.7 mmol/L (ref 3.5–5.1)
Sodium: 135 mmol/L (ref 135–145)
Total Bilirubin: 0.6 mg/dL (ref 0.3–1.2)
Total Protein: 7.5 g/dL (ref 6.5–8.1)

## 2020-11-01 LAB — PREGNANCY, URINE: Preg Test, Ur: NEGATIVE

## 2020-11-01 NOTE — Discharge Instructions (Addendum)
Please follow-up with your gynecologist.  You may need a MRI or other imaging.  Return for worsening pain, vomiting or other new concerning symptom.

## 2020-11-01 NOTE — ED Notes (Signed)
Pt via pov from home with rt abdominal pain. Pt has hx of ovarian cysts, called her ob-gyn and was told to come to ED for ultrasound as this could be torsion. Pain is different from her normal pain, more severe than usual. Pt states this started yesterday, went away for a while, and came back today. Pt alert & oriented, nad noted.

## 2020-11-01 NOTE — ED Triage Notes (Signed)
Lower back pain worse on the right flank. Possible ovarian torsion.

## 2020-11-02 NOTE — ED Provider Notes (Signed)
[facility] EMERGENCY DEPARTMENT Provider Note   CSN: [id number] Arrival date & time: 11/01/20  1653     History Chief Complaint  Patient presents with  . Back Pain    Carmen Harris is a 25 y.o. female.  Presents to ER with concern for right sided pain.  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  States that she struggles with pelvic pain frequently.  Pain today seems slightly different than normal, had sharp episodic pain throughout the day, right pelvic region, right low back.  No associated vomiting, no fevers.  No vomiting. No vaginal bleeding or discharge.   States that she discussed with her gynecologist office on-call and they recommended getting ultrasound to evaluate for ovarian torsion.  HPI     Past Medical History:  Diagnosis Date  . Acne   . Anxiety   . Depression   . Wears contact lenses     Patient Active Problem List   Diagnosis Date Noted  . 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 LAPAROSCOPIC OVARIAN CYSTECTOMY Bilateral 2014  . LAPAROSCOPIC OVARIAN CYSTECTOMY Right 03/03/2016   Procedure: LAPAROSCOPIC RIGHT OVARIAN CYSTECTOMY;  Surgeon: Governor Specking, MD;  Location: [facility];  Service: Gynecology;  Laterality: Right;  . TONSILLECTOMY  2015     OB History   No obstetric history on file.     Family History  Problem Relation Age of Onset  . Schizophrenia Paternal Uncle     Social History   Tobacco Use  . Smoking status: Former Smoker    Years: 1.00    Types: Cigarettes    Quit date: 09/03/2015    Years since quitting: 5.1  . Smokeless tobacco: Never Used  Substance Use Topics  . Alcohol use: Yes    Alcohol/week: 5.0 standard drinks    Types: 5 Shots of liquor per week  . Drug use: No     Home Medications Prior to Admission medications   Medication Sig Start Date End Date Taking? Authorizing Provider  buPROPion (WELLBUTRIN XL) 300 MG 24 hr tablet TAKE 1 TABLET EVERY DAY 05/10/15  Yes Hampton Abbot, MD  lisdexamfetamine (VYVANSE) 30 MG capsule Vyvanse 30 mg capsule   Yes [provider]  clindamycin-benzoyl peroxide (BENZACLIN) gel Apply topically every evening.    [provider]  doxycycline (VIBRAMYCIN) 100 MG capsule Take 100 mg by mouth 2 (two) times daily.    [provider]  ondansetron (ZOFRAN) 4 MG tablet Take 1 tablet (4 mg total) by mouth every 8 (eight) hours as needed for nausea or vomiting. 03/03/16   Governor Specking, MD  oxyCODONE-acetaminophen (ROXICET) 5-325 MG tablet Take 1 tablet by mouth every 4 (four) hours as needed for severe pain. 03/03/16   Governor Specking, MD  spironolactone (ALDACTONE) 100 MG tablet Take 100 mg by mouth every morning.    [provider]  tretinoin (RETIN-A) 0.025 % cream Apply topically at bedtime.    [provider]    Allergies    Patient has no known allergies.  Review of Systems   Review of Systems  Constitutional: Negative for chills and fever.  HENT: Negative for ear pain and sore throat.   Eyes: Negative for pain and visual disturbance.  Respiratory: Negative for cough and shortness of breath.   Cardiovascular: Negative for  chest pain and palpitations.  Gastrointestinal: Positive for abdominal pain and nausea. Negative for vomiting.  Genitourinary: Negative for dysuria and hematuria.  Musculoskeletal: Negative for arthralgias and back pain.  Skin: Negative for color change and rash.  Neurological: Negative for seizures and syncope.  All other systems reviewed and are negative.   Physical Exam Updated Vital Signs BP 121/77 (BP Location: Right Arm)   Pulse 87   Temp 98.2 F (36.8 C) (Oral)   Resp 18   Ht 5\' 9"  (1.753 m)   Wt 67.4 kg   LMP 10/12/2020   SpO2 98%    BMI 21.96 kg/m   Physical Exam Vitals and nursing note reviewed.  Constitutional:      General: She is not in acute distress.    Appearance: She is well-developed and well-nourished.  HENT:     Head: Normocephalic and atraumatic.  Eyes:     Conjunctiva/sclera: Conjunctivae normal.  Cardiovascular:     Rate and Rhythm: Normal rate and regular rhythm.     Heart sounds: No murmur heard.   Pulmonary:     Effort: Pulmonary effort is normal. No respiratory distress.     Breath sounds: Normal breath sounds.  Abdominal:     Palpations: Abdomen is soft.     Tenderness: There is no abdominal tenderness.  Musculoskeletal:        General: No edema.     Cervical back: Neck supple.  Skin:    General: Skin is warm and dry.  Neurological:     Mental Status: She is alert.  Psychiatric:        Mood and Affect: Mood and affect normal.     ED Results / Procedures / Treatments   Labs (all labs ordered are listed, but only abnormal results are displayed) Labs Reviewed  URINALYSIS, ROUTINE W REFLEX MICROSCOPIC - Abnormal; Notable for the following components:      Result Value   APPearance HAZY (*)    All other components within normal limits  PREGNANCY, URINE  CBC WITH DIFFERENTIAL/PLATELET  COMPREHENSIVE METABOLIC PANEL    EKG None  Radiology US PELVIC COMPLETE W TRANSVAGINAL AND TORSION R/O  Result Date: 11/01/2020 CLINICAL DATA:  Initial evaluation for acute right flank pain. History of ovarian cyst. EXAM: TRANSABDOMINAL AND TRANSVAGINAL ULTRASOUND OF PELVIS DOPPLER ULTRASOUND OF OVARIES TECHNIQUE: Both transabdominal and transvaginal ultrasound examinations of the pelvis were performed. Transabdominal technique was performed for global imaging of the pelvis including uterus, ovaries, adnexal regions, and pelvic cul-de-sac. It was necessary to proceed with endovaginal exam following the transabdominal exam to visualize the uterus, endometrium, and ovaries. Color and duplex  Doppler ultrasound was utilized to evaluate blood flow to the ovaries. COMPARISON:  Prior MRI from 11/27/2019. FINDINGS: Uterus Measurements: 8.9 x 4.1 x 4.8 cm = volume: 90.4 mL. Uterus is anteverted. No discrete fibroid or other mass. Endometrium Thickness: 8 mm.  No focal abnormality visualized. Right ovary Measurements: 7.7 x 5.3 x 6.5 cm = volume: 139.7 mL. Large complex right adnexal lesion is seen arising from the right ovary. Lesion demonstrates both cystic and internal solid components. The cystic portion of this lesion measures 6.8 x 4.5 x 6.1 cm. 3 additional ovoid echogenic and somewhat nodular solid components measure 1.8 x 1.7 x 1.5 cm, 1.6 x 1.5 x 2.1 cm, and 1.4 x 1.7 x 1.6 cm. Scattered areas of possible vascularity seen within the echogenic components of this lesion. Finding is indeterminate, but could reflect an ovarian dermoid. Left ovary Measurements:  5.0 x 2.3 x 3.3 cm = volume: 19.7 mL. Complex cystic lesion seen within the left adnexa, arising from the left ovary. Lesion demonstrates a complex cystic component measuring 2.8 x 2.5 x 2.4 cm. Internal lace-like architecture within this cystic lesion most typical of a hemorrhagic cyst. Adjacent hyperechoic solid nodule measures 1.6 x 1.1 x 1.3 cm. No appreciable vascularity evident by sonography. Finding is indeterminate, and could also reflect an ovarian dermoid, or possibly dermoid and/or concomitant hemorrhagic cyst. Pulsed Doppler evaluation of both ovaries demonstrates normal low-resistance arterial and venous waveforms. Other findings Small volume free fluid within the pelvis. IMPRESSION: 1. Multiple complex cystic and solid lesions involving both ovaries, measuring up to 6.8 cm on the right and 2.8 cm on the left. Findings are indeterminate, but could reflect ovarian dermoids. There may be a concomitant hemorrhagic cyst involving the left ovary. Possible cystic ovarian neoplasms not excluded. Gynecologic referral for further workup and  consultation recommended. Additionally, further evaluation with dedicated MRI, with and without contrast, suggested for further evaluation. 2. No evidence for ovarian torsion. 3. Small volume free fluid within the pelvis. 4. Normal sonographic appearance of the uterus and endometrium. Electronically Signed   By: Jeannine Boga M.D.   On: 11/01/2020 19:48    Procedures Procedures (including critical care time)  Medications Ordered in ED Medications - No data to display  ED Course  I have reviewed the triage vital signs and the nursing notes.  Pertinent labs & imaging results that were available during my care of the patient were reviewed by me and considered in my medical decision making (see chart for details).    MDM Rules/Calculators/A&P                          24 year old lady presents to ER with concern for right-sided pelvic pain, back pain.  On exam, patient well-appearing in no distress, normal vital signs, abdomen soft and nontender.  Her basic labs are grossly stable.  Low suspicion for acute abdominal process.  Transvaginal ultrasound ordered to rule out ovarian torsion.  TVUS negative for torsion, did demonstrate likely Multiple complex cystic and solid lesions involving both ovaries.  Discussed findings with patient.  Given her benign exam, normal vitals and normal labs, believe patient can be discharged and managed in the outpatient setting.  Recommended close follow-up with her gynecologist discussion of further work-up and management with them.     After the discussed management above, the patient was determined to be safe for discharge.  The patient was in agreement with this plan and all questions regarding their care were answered.  ED return precautions were discussed and the patient will return to the ED with any significant worsening of condition.   Final Clinical Impression(s) / ED Diagnoses Final diagnoses:  Pelvic pain    Rx / DC Orders ED Discharge Orders     None       Lucrezia Starch, MD 11/02/20 1549

## 2020-11-07 ENCOUNTER — Inpatient Hospital Stay: Admission: RE | Admit: 2020-11-07 | Payer: BC Managed Care – PPO | Source: Ambulatory Visit

## 2021-01-10 DIAGNOSIS — Z8616 Personal history of COVID-19: Secondary | ICD-10-CM

## 2021-01-10 HISTORY — DX: Personal history of COVID-19: Z86.16

## 2021-09-18 IMAGING — US US PELVIS COMPLETE TRANSABD/TRANSVAG W DUPLEX
1 series · 13 of 25 positions shown · non-contrast
Comparison: Prior MRI from 11/27/2019.

CLINICAL DATA: Initial evaluation for acute right flank pain.
History of ovarian cyst.

EXAM:
TRANSABDOMINAL AND TRANSVAGINAL ULTRASOUND OF PELVIS
DOPPLER ULTRASOUND OF OVARIES
TECHNIQUE: Both transabdominal and transvaginal ultrasound examinations of the
pelvis were performed. Transabdominal technique was performed for
global imaging of the pelvis including uterus, ovaries, adnexal
regions, and pelvic cul-de-sac.
It was necessary to proceed with endovaginal exam following the
transabdominal exam to visualize the uterus, endometrium, and
ovaries. Color and duplex Doppler ultrasound was utilized to
evaluate blood flow to the ovaries.

[Series 1: us pelvis complete transabd/transvag w duplex · 13 of 148 slices shown]
[im 1/148]
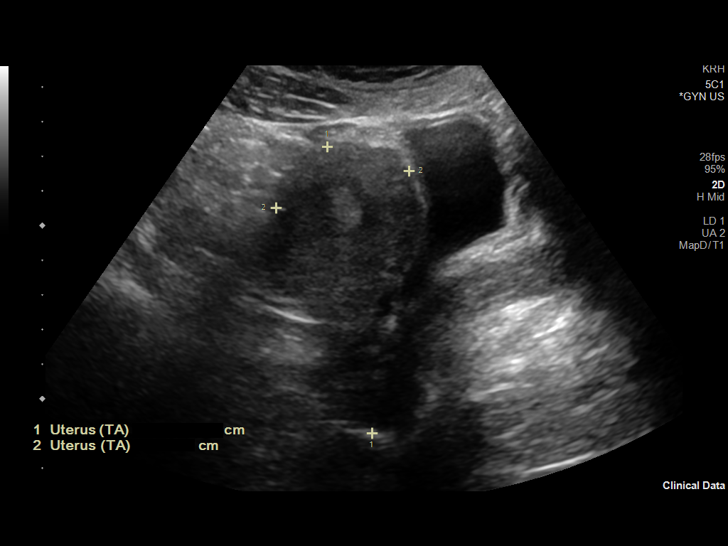
[im 13/148]
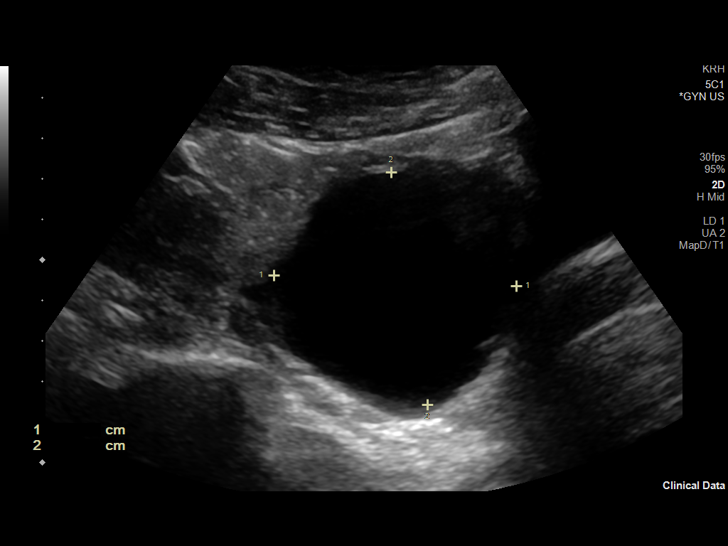
[im 25/148]
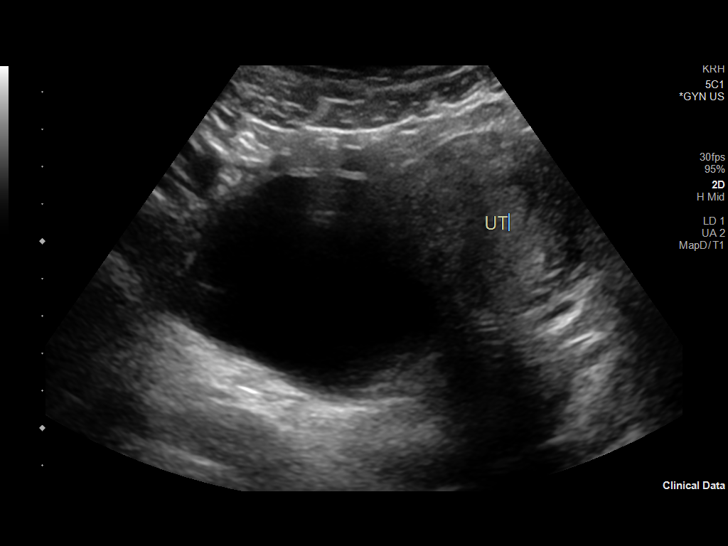
[im 37/148]
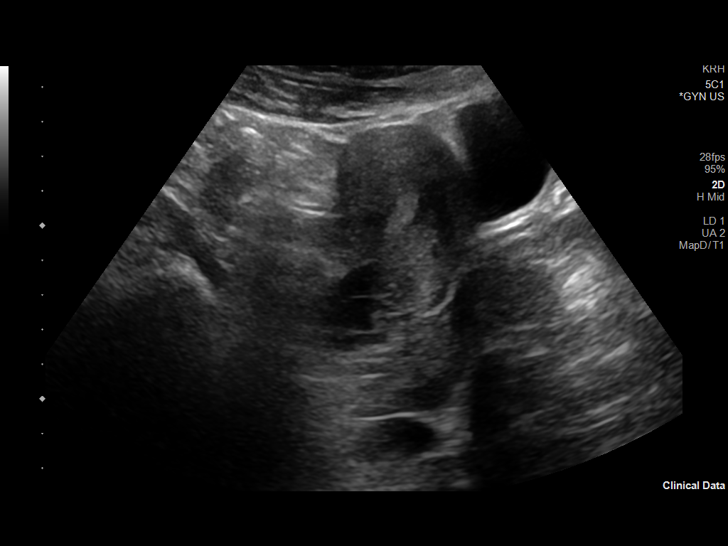
[im 50/148]
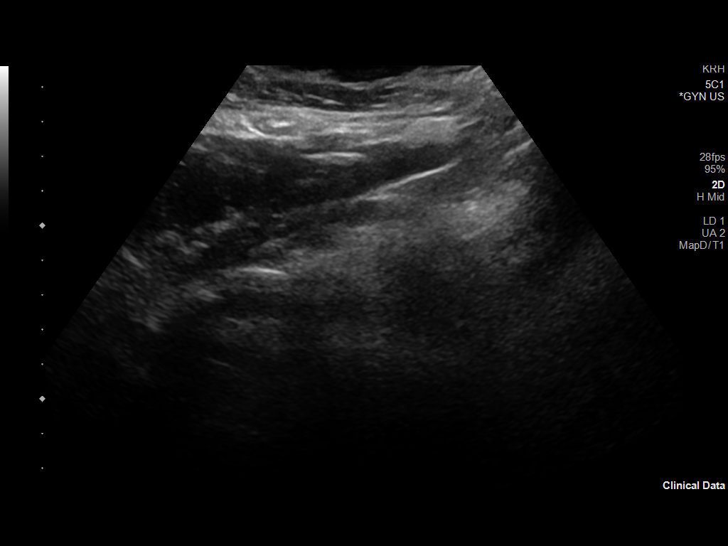
[im 62/148]
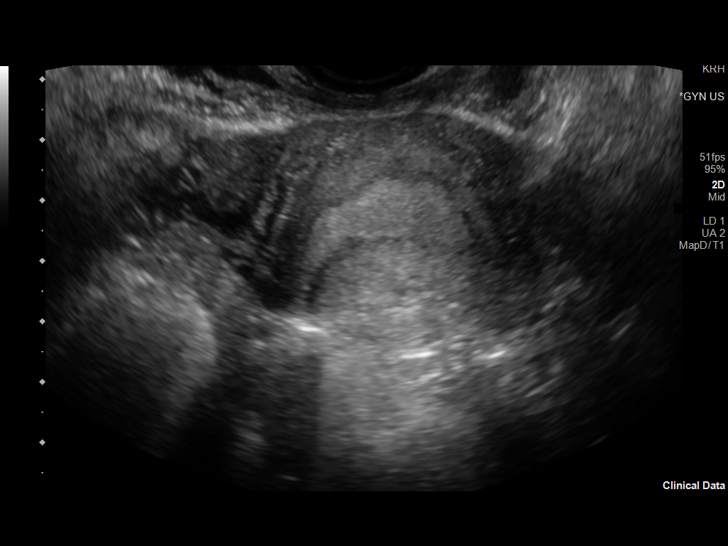
[im 74/148]
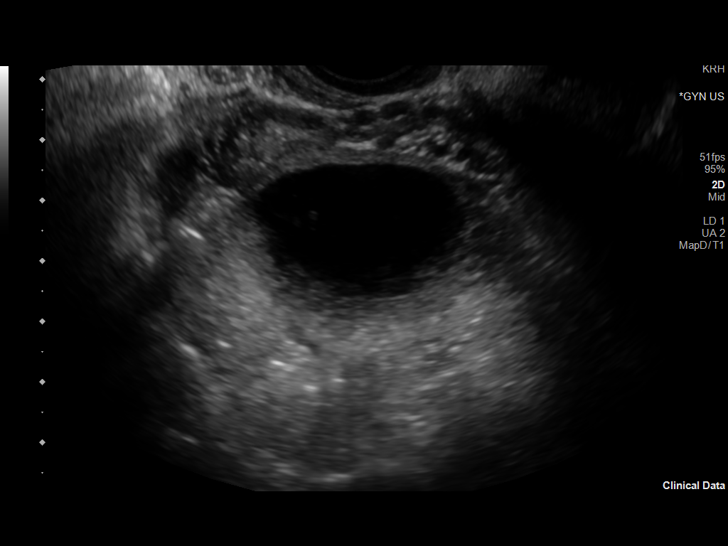
[im 86/148]
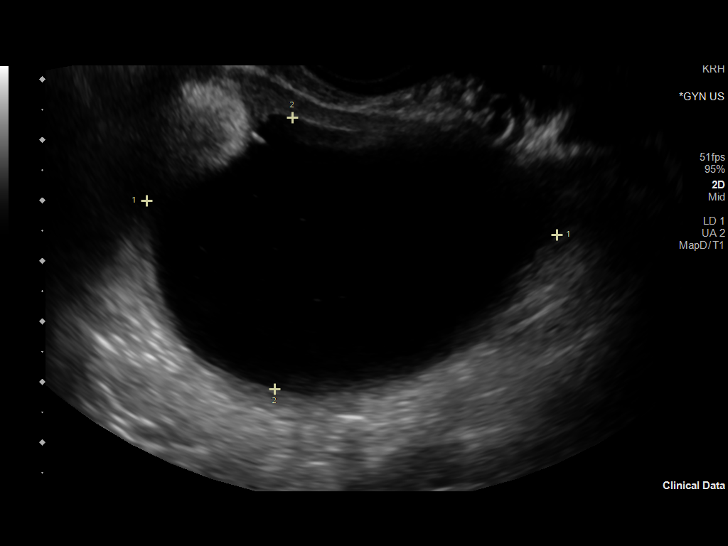
[im 99/148]
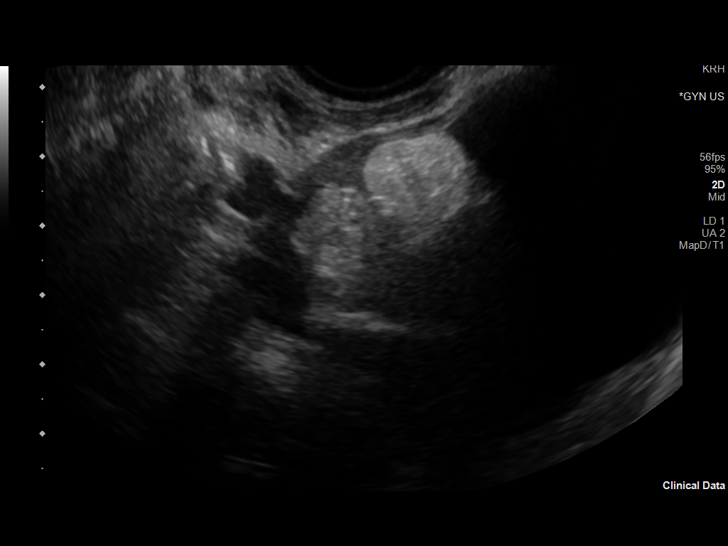
[im 111/148]
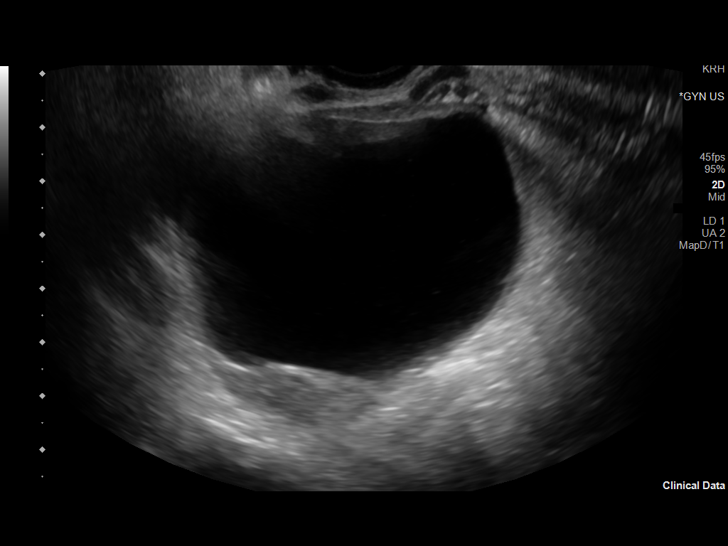
[im 123/148]
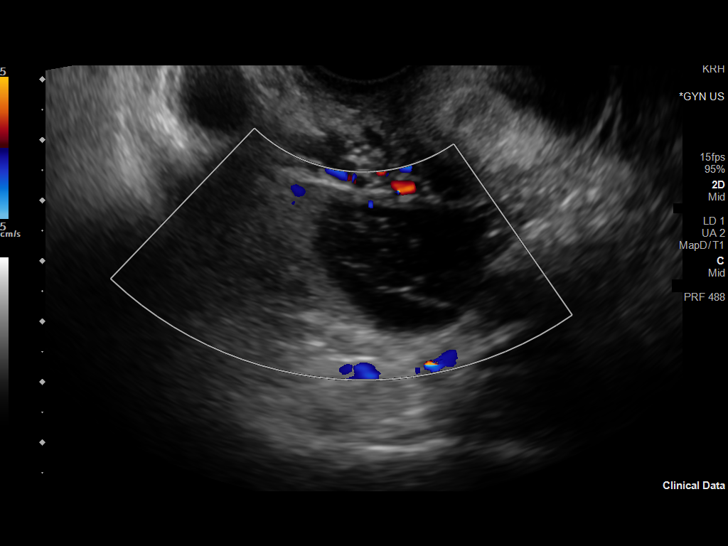
[im 135/148]
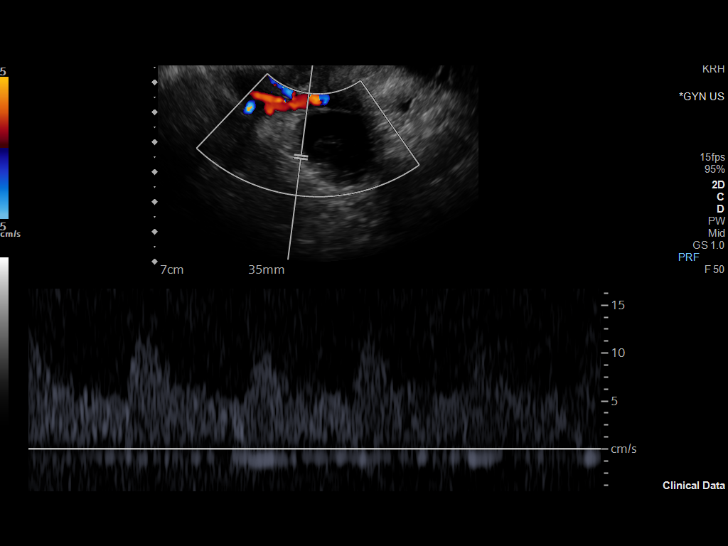
[im 148/148]
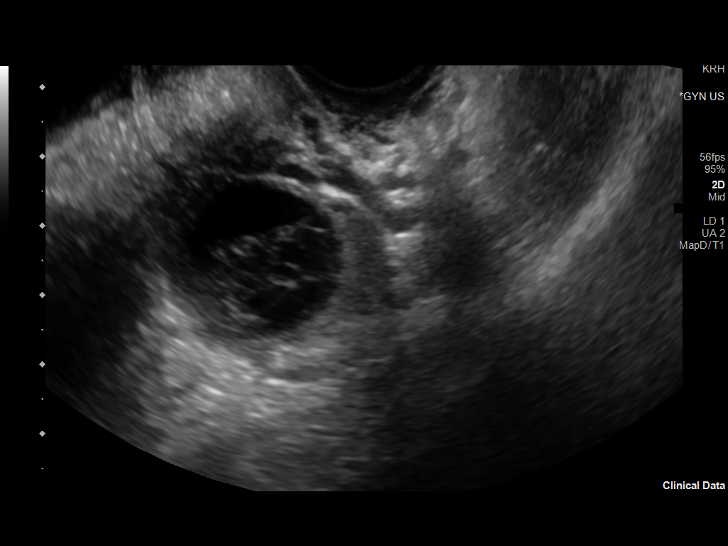

[13 of 25 positions shown; findings below may reference images not displayed]

FINDINGS: Uterus

Measurements: 8.9 x 4.1 x 4.8 cm = volume: 90.4 mL. Uterus is
anteverted. No discrete fibroid or other mass.

Endometrium

Thickness: 8 mm.  No focal abnormality visualized.

Right ovary

Measurements: 7.7 x 5.3 x 6.5 cm = volume: 139.7 mL. Large complex
right adnexal lesion is seen arising from the right ovary. Lesion
demonstrates both cystic and internal solid components. The cystic
portion of this lesion measures 6.8 x 4.5 x 6.1 cm. 3 additional
ovoid echogenic and somewhat nodular solid components measure 1.8 x
1.7 x 1.5 cm, 1.6 x 1.5 x 2.1 cm, and 1.4 x 1.7 x 1.6 cm. Scattered
areas of possible vascularity seen within the echogenic components
of this lesion. Finding is indeterminate, but could reflect an
ovarian dermoid.

Left ovary

Measurements: 5.0 x 2.3 x 3.3 cm = volume: 19.7 mL. Complex cystic
lesion seen within the left adnexa, arising from the left ovary.
Lesion demonstrates a complex cystic component measuring 2.8 x 2.5 x
2.4 cm. Internal lace-like architecture within this cystic lesion
most typical of a hemorrhagic cyst. Adjacent hyperechoic solid
nodule measures 1.6 x 1.1 x 1.3 cm. No appreciable vascularity
evident by sonography. Finding is indeterminate, and could also
reflect an ovarian dermoid, or possibly dermoid and/or concomitant
hemorrhagic cyst.

Pulsed Doppler evaluation of both ovaries demonstrates normal
low-resistance arterial and venous waveforms.

Other findings

Small volume free fluid within the pelvis.
IMPRESSION: 1. Multiple complex cystic and solid lesions involving both ovaries,
measuring up to 6.8 cm on the right and 2.8 cm on the left. Findings
are indeterminate, but could reflect ovarian dermoids. There may be
a concomitant hemorrhagic cyst involving the left ovary. Possible
cystic ovarian neoplasms not excluded. Gynecologic referral for
further workup and consultation recommended. Additionally, further
evaluation with dedicated MRI, with and without contrast, suggested
for further evaluation.
2. No evidence for ovarian torsion.
3. Small volume free fluid within the pelvis.
4. Normal sonographic appearance of the uterus and endometrium.

## 2021-10-08 ENCOUNTER — Other Ambulatory Visit: Payer: Self-pay | Admitting: Obstetrics and Gynecology

## 2022-01-27 ENCOUNTER — Other Ambulatory Visit: Payer: Self-pay

## 2022-01-27 ENCOUNTER — Encounter (HOSPITAL_BASED_OUTPATIENT_CLINIC_OR_DEPARTMENT_OTHER): Payer: Self-pay | Admitting: Obstetrics and Gynecology

## 2022-01-27 NOTE — Progress Notes (Addendum)
Spoke w/ via phone for pre-op interview--pt ?Lab needs dos----  t&s, urine preg             ?Lab results------ no ?COVID test -----patient states asymptomatic no test needed ?Arrive at ------- 1100 on 01-30-2022 ?NPO after MN NO Solid Food.  Clear liquids from MN until--- 1000 ?Med rec completed ?Medications to take morning of surgery ----- wellbutrin, zyrtec ?Diabetic medication ----- n/a ?Patient instructed no nail polish to be worn day of surgery ?Patient instructed to bring photo id and insurance card day of surgery ?Patient aware to have Driver (ride ) / caregiver for 24 hours after surgery --mother, lisa ?Patient Special Instructions ----- n/a ?Pre-Op special Istructions ----- per pt please do not let her see weight on scale due to anorexia  ?Patient verbalized understanding of instructions that were given at this phone interview. ?Patient denies shortness of breath, chest pain, fever, cough at this phone interview.  ?

## 2022-01-30 ENCOUNTER — Other Ambulatory Visit: Payer: Self-pay

## 2022-01-30 ENCOUNTER — Encounter (HOSPITAL_BASED_OUTPATIENT_CLINIC_OR_DEPARTMENT_OTHER): Payer: Self-pay | Admitting: Obstetrics and Gynecology

## 2022-01-30 ENCOUNTER — Ambulatory Visit (HOSPITAL_BASED_OUTPATIENT_CLINIC_OR_DEPARTMENT_OTHER)
Admission: RE | Admit: 2022-01-30 | Discharge: 2022-01-30 | Disposition: A | Discharge status: Home / Self Care | Payer: BC Managed Care – PPO | Source: Ambulatory Visit | Attending: Obstetrics and Gynecology | Admitting: Obstetrics and Gynecology

## 2022-01-30 ENCOUNTER — Ambulatory Visit (HOSPITAL_BASED_OUTPATIENT_CLINIC_OR_DEPARTMENT_OTHER): Payer: BC Managed Care – PPO | Admitting: Anesthesiology

## 2022-01-30 ENCOUNTER — Encounter (HOSPITAL_BASED_OUTPATIENT_CLINIC_OR_DEPARTMENT_OTHER)
Admission: RE | Disposition: A | Discharge status: Home / Self Care | Payer: Self-pay | Source: Ambulatory Visit | Attending: Obstetrics and Gynecology

## 2022-01-30 DIAGNOSIS — Z87891 Personal history of nicotine dependence: Secondary | ICD-10-CM | POA: Insufficient documentation

## 2022-01-30 DIAGNOSIS — D270 Benign neoplasm of right ovary: Secondary | ICD-10-CM | POA: Diagnosis not present

## 2022-01-30 DIAGNOSIS — D271 Benign neoplasm of left ovary: Secondary | ICD-10-CM | POA: Insufficient documentation

## 2022-01-30 DIAGNOSIS — N80103 Endometriosis of bilateral ovaries, unspecified depth: Secondary | ICD-10-CM | POA: Diagnosis not present

## 2022-01-30 DIAGNOSIS — G8929 Other chronic pain: Secondary | ICD-10-CM | POA: Insufficient documentation

## 2022-01-30 DIAGNOSIS — N736 Female pelvic peritoneal adhesions (postinfective): Secondary | ICD-10-CM | POA: Diagnosis not present

## 2022-01-30 DIAGNOSIS — N80122 Deep endometriosis of left ovary: Secondary | ICD-10-CM | POA: Insufficient documentation

## 2022-01-30 HISTORY — DX: Generalized anxiety disorder: F41.1

## 2022-01-30 HISTORY — DX: Anorexia nervosa, unspecified: F50.00

## 2022-01-30 HISTORY — PX: ROBOTIC ASSISTED LAPAROSCOPIC OVARIAN CYSTECTOMY: SHX6081

## 2022-01-30 HISTORY — DX: Unspecified ovarian cyst, right side: N83.201

## 2022-01-30 HISTORY — DX: Endometriosis of pelvic peritoneum, unspecified: N80.30

## 2022-01-30 HISTORY — DX: Other seasonal allergic rhinitis: J30.2

## 2022-01-30 HISTORY — DX: Nausea with vomiting, unspecified: Z98.890

## 2022-01-30 LAB — TYPE AND SCREEN
ABO/RH(D): A POS
Antibody Screen: NEGATIVE

## 2022-01-30 LAB — ABO/RH: ABO/RH(D): A POS

## 2022-01-30 LAB — POCT PREGNANCY, URINE: Preg Test, Ur: NEGATIVE

## 2022-01-30 SURGERY — EXCISION, CYST, OVARY, ROBOT-ASSISTED, LAPAROSCOPIC
Anesthesia: General | Site: Abdomen | Laterality: Bilateral

## 2022-01-30 MED ORDER — ROCURONIUM BROMIDE 10 MG/ML (PF) SYRINGE
PREFILLED_SYRINGE | INTRAVENOUS | Status: DC | PRN
Start: 2022-01-30 — End: 2022-01-30
  Administered 2022-01-30: 50 mg via INTRAVENOUS
  Administered 2022-01-30: 10 mg via INTRAVENOUS

## 2022-01-30 MED ORDER — PROPOFOL 10 MG/ML IV BOLUS
INTRAVENOUS | Status: DC | PRN
  Administered 2022-01-30: 150 mg via INTRAVENOUS

## 2022-01-30 MED ORDER — DEXMEDETOMIDINE (PRECEDEX) IN NS 20 MCG/5ML (4 MCG/ML) IV SYRINGE
PREFILLED_SYRINGE | INTRAVENOUS | Status: DC | PRN
  Administered 2022-01-30 (×3): 4 ug via INTRAVENOUS

## 2022-01-30 MED ORDER — BUPIVACAINE-EPINEPHRINE 0.25% -1:200000 IJ SOLN
INTRAMUSCULAR | Status: DC | PRN
  Administered 2022-01-30: 10 mL

## 2022-01-30 MED ORDER — CEFAZOLIN SODIUM-DEXTROSE 2-4 GM/100ML-% IV SOLN
INTRAVENOUS | Status: AC
  Filled 2022-01-30: qty 100

## 2022-01-30 MED ORDER — METHYLENE BLUE 1 % INJ SOLN
INTRAVENOUS | Status: DC | PRN
Start: 2022-01-30 — End: 2022-01-30
  Administered 2022-01-30: 10 mL

## 2022-01-30 MED ORDER — MIDAZOLAM HCL 2 MG/2ML IJ SOLN
INTRAMUSCULAR | Status: DC | PRN
  Administered 2022-01-30: 2 mg via INTRAVENOUS

## 2022-01-30 MED ORDER — ONDANSETRON HCL 4 MG/2ML IJ SOLN: 4.0000 mg | Freq: Once | INTRAMUSCULAR | Status: DC | PRN

## 2022-01-30 MED ORDER — AMISULPRIDE (ANTIEMETIC) 5 MG/2ML IV SOLN: 10.0000 mg | Freq: Once | INTRAVENOUS | Status: DC | PRN

## 2022-01-30 MED ORDER — KETAMINE HCL 50 MG/5ML IJ SOSY
PREFILLED_SYRINGE | INTRAMUSCULAR | Status: AC
  Filled 2022-01-30: qty 5

## 2022-01-30 MED ORDER — FENTANYL CITRATE (PF) 250 MCG/5ML IJ SOLN
INTRAMUSCULAR | Status: AC
  Filled 2022-01-30: qty 5

## 2022-01-30 MED ORDER — LIDOCAINE 2% (20 MG/ML) 5 ML SYRINGE
INTRAMUSCULAR | Status: DC | PRN
  Administered 2022-01-30: 80 mg via INTRAVENOUS

## 2022-01-30 MED ORDER — KETAMINE HCL 10 MG/ML IJ SOLN
INTRAMUSCULAR | Status: DC | PRN
  Administered 2022-01-30 (×3): 10 mg via INTRAVENOUS

## 2022-01-30 MED ORDER — DEXAMETHASONE SODIUM PHOSPHATE 10 MG/ML IJ SOLN
INTRAMUSCULAR | Status: DC | PRN
  Administered 2022-01-30: 10 mg via INTRAVENOUS

## 2022-01-30 MED ORDER — SCOPOLAMINE 1 MG/3DAYS TD PT72
1.0000 | MEDICATED_PATCH | TRANSDERMAL | Status: DC
  Administered 2022-01-30: 1.5 mg via TRANSDERMAL

## 2022-01-30 MED ORDER — POVIDONE-IODINE 10 % EX SWAB
2.0000 | Freq: Once | CUTANEOUS | Status: DC
Start: 2022-01-30 — End: 2022-01-30

## 2022-01-30 MED ORDER — LACTATED RINGERS IV SOLN: INTRAVENOUS | Status: DC

## 2022-01-30 MED ORDER — NORETHINDRONE ACETATE 5 MG PO TABS: 2.5000 mg | ORAL_TABLET | Freq: Every day | ORAL | 11 refills | Status: AC

## 2022-01-30 MED ORDER — FENTANYL CITRATE (PF) 100 MCG/2ML IJ SOLN
25.0000 ug | INTRAMUSCULAR | Status: DC | PRN
  Administered 2022-01-30: 25 ug via INTRAVENOUS

## 2022-01-30 MED ORDER — ACETAMINOPHEN 500 MG PO TABS
ORAL_TABLET | ORAL | Status: AC
  Filled 2022-01-30: qty 2

## 2022-01-30 MED ORDER — ONDANSETRON HCL 4 MG/2ML IJ SOLN
INTRAMUSCULAR | Status: DC | PRN
  Administered 2022-01-30: 4 mg via INTRAVENOUS

## 2022-01-30 MED ORDER — OXYCODONE HCL 5 MG PO TABS: 5.0000 mg | ORAL_TABLET | Freq: Once | ORAL | Status: DC | PRN

## 2022-01-30 MED ORDER — FENTANYL CITRATE (PF) 250 MCG/5ML IJ SOLN
INTRAMUSCULAR | Status: DC | PRN
  Administered 2022-01-30: 25 ug via INTRAVENOUS
  Administered 2022-01-30 (×2): 50 ug via INTRAVENOUS
  Administered 2022-01-30: 25 ug via INTRAVENOUS

## 2022-01-30 MED ORDER — SCOPOLAMINE 1 MG/3DAYS TD PT72
MEDICATED_PATCH | TRANSDERMAL | Status: AC
  Filled 2022-01-30: qty 1

## 2022-01-30 MED ORDER — KETOROLAC TROMETHAMINE 30 MG/ML IJ SOLN
INTRAMUSCULAR | Status: DC | PRN
  Administered 2022-01-30: 30 mg via INTRAVENOUS

## 2022-01-30 MED ORDER — FENTANYL CITRATE (PF) 100 MCG/2ML IJ SOLN
INTRAMUSCULAR | Status: AC
  Filled 2022-01-30: qty 2

## 2022-01-30 MED ORDER — SODIUM CHLORIDE 0.9 % IR SOLN
Status: DC | PRN
  Administered 2022-01-30 (×2): 1000 mL

## 2022-01-30 MED ORDER — ACETAMINOPHEN 500 MG PO TABS
ORAL_TABLET | ORAL | Status: AC
  Filled 2022-01-30: qty 1

## 2022-01-30 MED ORDER — OXYCODONE-ACETAMINOPHEN 7.5-325 MG PO TABS: 1.0000 | ORAL_TABLET | ORAL | 0 refills | Status: AC | PRN

## 2022-01-30 MED ORDER — CEFAZOLIN SODIUM-DEXTROSE 2-3 GM-%(50ML) IV SOLR
INTRAVENOUS | Status: DC | PRN
  Administered 2022-01-30: 2 g via INTRAVENOUS

## 2022-01-30 MED ORDER — ONDANSETRON HCL 4 MG PO TABS: 4.0000 mg | ORAL_TABLET | Freq: Every day | ORAL | 1 refills | Status: AC | PRN

## 2022-01-30 MED ORDER — OXYCODONE HCL 5 MG/5ML PO SOLN: 5.0000 mg | Freq: Once | ORAL | Status: DC | PRN

## 2022-01-30 MED ORDER — MIDAZOLAM HCL 2 MG/2ML IJ SOLN
INTRAMUSCULAR | Status: AC
  Filled 2022-01-30: qty 2

## 2022-01-30 MED ORDER — ACETAMINOPHEN 500 MG PO TABS
1000.0000 mg | ORAL_TABLET | Freq: Once | ORAL | Status: AC
  Administered 2022-01-30: 1000 mg via ORAL

## 2022-01-30 MED ORDER — 0.9 % SODIUM CHLORIDE (POUR BTL) OPTIME
TOPICAL | Status: DC | PRN
  Administered 2022-01-30: 500 mL

## 2022-01-30 SURGICAL SUPPLY — 69 items
ADH SKN CLS APL DERMABOND .7 (GAUZE/BANDAGES/DRESSINGS) ×1
BARRIER ADHS 3X4 INTERCEED (GAUZE/BANDAGES/DRESSINGS) ×2 IMPLANT
BLADE SURG 15 STRL LF DISP TIS (BLADE) IMPLANT
BLADE SURG 15 STRL SS (BLADE) ×2
BRR ADH 4X3 ABS CNTRL BYND (GAUZE/BANDAGES/DRESSINGS) ×1
BRR ADH 6X5 SEPRAFILM 1 SHT (MISCELLANEOUS) ×2
CATH FOLEY 3WAY  5CC 16FR (CATHETERS) ×2
CATH FOLEY 3WAY 5CC 16FR (CATHETERS) ×1 IMPLANT
CATH ROBINSON RED A/P 16FR (CATHETERS) IMPLANT
CNTNR URN SCR LID CUP LEK RST (MISCELLANEOUS) ×1 IMPLANT
CONT SPEC 4OZ STRL OR WHT (MISCELLANEOUS) ×2
COVER BACK TABLE 60X90IN (DRAPES) ×4 IMPLANT
COVER TIP SHEARS 8 DVNC (MISCELLANEOUS) ×1 IMPLANT
COVER TIP SHEARS 8MM DA VINCI (MISCELLANEOUS) ×2
DECANTER SPIKE VIAL GLASS SM (MISCELLANEOUS) ×4 IMPLANT
DEFOGGER SCOPE WARMER CLEARIFY (MISCELLANEOUS) ×2 IMPLANT
DEPRESSOR TONGUE BLADE STERILE (MISCELLANEOUS) IMPLANT
DERMABOND ADVANCED (GAUZE/BANDAGES/DRESSINGS) ×1
DERMABOND ADVANCED .7 DNX12 (GAUZE/BANDAGES/DRESSINGS) ×1 IMPLANT
DRAPE ARM DVNC X/XI (DISPOSABLE) ×4 IMPLANT
DRAPE COLUMN DVNC XI (DISPOSABLE) ×1 IMPLANT
DRAPE DA VINCI XI ARM (DISPOSABLE) ×8
DRAPE DA VINCI XI COLUMN (DISPOSABLE) ×2
DRSG OPSITE POSTOP 3X4 (GAUZE/BANDAGES/DRESSINGS) ×2 IMPLANT
DRSG TELFA 3X8 NADH (GAUZE/BANDAGES/DRESSINGS) ×2 IMPLANT
ELECT REM PT RETURN 9FT ADLT (ELECTROSURGICAL) ×2
ELECTRODE REM PT RTRN 9FT ADLT (ELECTROSURGICAL) ×1 IMPLANT
GAUZE 4X4 16PLY ~~LOC~~+RFID DBL (SPONGE) ×4 IMPLANT
GLOVE BIO SURGEON STRL SZ8 (GLOVE) ×6 IMPLANT
GLOVE BIOGEL PI IND STRL 7.0 (GLOVE) ×1 IMPLANT
GLOVE BIOGEL PI IND STRL 8.5 (GLOVE) ×1 IMPLANT
GLOVE BIOGEL PI INDICATOR 7.0 (GLOVE) ×1
GLOVE BIOGEL PI INDICATOR 8.5 (GLOVE) ×1
GLOVE ECLIPSE 6.5 STRL STRAW (GLOVE) ×6 IMPLANT
IRRIG SUCT STRYKERFLOW 2 WTIP (MISCELLANEOUS) ×2
IRRIGATION SUCT STRKRFLW 2 WTP (MISCELLANEOUS) ×1 IMPLANT
KIT TURNOVER CYSTO (KITS) ×2 IMPLANT
NDL SPNL 22GX7 QUINCKE BK (NEEDLE) IMPLANT
NEEDLE INSUFFLATION 120MM (ENDOMECHANICALS) ×2 IMPLANT
NEEDLE SPNL 22GX7 QUINCKE BK (NEEDLE) IMPLANT
NS IRRIG 1000ML POUR BTL (IV SOLUTION) ×6 IMPLANT
OBTURATOR OPTICAL STANDARD 8MM (TROCAR) ×2
OBTURATOR OPTICAL STND 8 DVNC (TROCAR) ×1
OBTURATOR OPTICALSTD 8 DVNC (TROCAR) IMPLANT
OCCLUDER COLPOPNEUMO (BALLOONS) IMPLANT
PACK ROBOT WH (CUSTOM PROCEDURE TRAY) ×2 IMPLANT
PACK ROBOTIC GOWN (GOWN DISPOSABLE) ×2 IMPLANT
PACK TRENDGUARD 450 HYBRID PRO (MISCELLANEOUS) IMPLANT
PAD DRESSING TELFA 3X8 NADH (GAUZE/BANDAGES/DRESSINGS) IMPLANT
PAD PREP 24X48 CUFFED NSTRL (MISCELLANEOUS) ×2 IMPLANT
PROTECTOR NERVE ULNAR (MISCELLANEOUS) ×4 IMPLANT
SEAL CANN UNIV 5-8 DVNC XI (MISCELLANEOUS) ×3 IMPLANT
SEAL XI 5MM-8MM UNIVERSAL (MISCELLANEOUS) ×6
SEPRAFILM MEMBRANE 5X6 (MISCELLANEOUS) ×2 IMPLANT
SET IRRIG Y TYPE TUR BLADDER L (SET/KITS/TRAYS/PACK) IMPLANT
SET TRI-LUMEN FLTR TB AIRSEAL (TUBING) IMPLANT
SPONGE T-LAP 4X18 ~~LOC~~+RFID (SPONGE) ×2 IMPLANT
SUT MNCRL AB 4-0 PS2 18 (SUTURE) ×1 IMPLANT
SUT PDS AB 4-0 SH 27 (SUTURE) IMPLANT
SYS RETRIEVAL 5MM INZII UNIV (BASKET) ×2
SYSTEM RETRIEVL 5MM INZII UNIV (BASKET) IMPLANT
TIP UTERINE 5.1X6CM LAV DISP (MISCELLANEOUS) IMPLANT
TIP UTERINE 6.7X10CM GRN DISP (MISCELLANEOUS) IMPLANT
TIP UTERINE 6.7X6CM WHT DISP (MISCELLANEOUS) IMPLANT
TIP UTERINE 6.7X8CM BLUE DISP (MISCELLANEOUS) IMPLANT
TOWEL OR 17X26 10 PK STRL BLUE (TOWEL DISPOSABLE) ×6 IMPLANT
TRENDGUARD 450 HYBRID PRO PACK (MISCELLANEOUS)
TROCAR PORT AIRSEAL 5X120 (TROCAR) ×1 IMPLANT
WATER STERILE IRR 1000ML POUR (IV SOLUTION) ×2 IMPLANT

## 2022-01-30 NOTE — Discharge Instructions (Signed)
DISCHARGE INSTRUCTIONS: Laparoscopy  The following instructions have been prepared to help you care for yourself upon your return home today.  Wound care: . Do not get the incision wet for the first 24 hours. The incision should be kept clean and dry. . The Band-Aids or dressings may be removed the day after surgery. . Should the incision become sore, red, and swollen after the first week, check with your doctor.  Personal hygiene: . Shower the day after your procedure.  Activity and limitations: . Do NOT drive or operate any equipment today. . Do NOT lift anything more than 15 pounds for 2-3 weeks after surgery. . Do NOT rest in bed all day. . Walking is encouraged. Walk each day, starting slowly with 5-minute walks 3 or 4 times a day. Slowly increase the length of your walks. . Walk up and down stairs slowly. . Do NOT do strenuous activities, such as golfing, playing tennis, bowling, running, biking, weight lifting, gardening, mowing, or vacuuming for 2-4 weeks. Ask your doctor when it is okay to start.  Diet: Eat a light meal as desired this evening. You may resume your usual diet tomorrow.  Return to work: This is dependent on the type of work you do. For the most part you can return to a desk job within a week of surgery. If you are more active at work, please discuss this with your doctor.  What to expect after your surgery: You may have a slight burning sensation when you urinate on the first day. You may have a very small amount of blood in the urine. Expect to have a small amount of vaginal discharge/light bleeding for 1-2 weeks. It is not unusual to have abdominal soreness and bruising for up to 2 weeks. You may be tired and need more rest for about 1 week. You may experience shoulder pain for 24-72 hours. Lying flat in bed may relieve it.  Call your doctor for any of the following: . Develop a fever of 100.4 or greater . Inability to urinate 6 hours after discharge from  hospital . Severe pain not relieved by pain medications . Persistent of heavy bleeding at incision site . Redness or swelling around incision site after a week . Increasing nausea or vomiting   Post Anesthesia Home Care Instructions  Activity: Get plenty of rest for the remainder of the day. A responsible adult should stay with you for 24 hours following the procedure.  For the next 24 hours, DO NOT: -Drive a car -Operate machinery -Drink alcoholic beverages -Take any medication unless instructed by your physician -Make any legal decisions or sign important papers.  Meals: Start with liquid foods such as gelatin or soup. Progress to regular foods as tolerated. Avoid greasy, spicy, heavy foods. If nausea and/or vomiting occur, drink only clear liquids until the nausea and/or vomiting subsides. Call your physician if vomiting continues.  Special Instructions/Symptoms: Your throat may feel dry or sore from the anesthesia or the breathing tube placed in your throat during surgery. If this causes discomfort, gargle with warm salt water. The discomfort should disappear within 24 hours.  If you had a scopolamine patch placed behind your ear for the management of post- operative nausea and/or vomiting:  1. The medication in the patch is effective for 72 hours, after which it should be removed.  Wrap patch in a tissue and discard in the trash. Wash hands thoroughly with soap and water. 2. You may remove the patch earlier than 72   hours if you experience unpleasant side effects which may include dry mouth, dizziness or visual disturbances. 3. Avoid touching the patch. Wash your hands with soap and water after contact with the patch.    

## 2022-01-30 NOTE — Op Note (Signed)
OPERATIVE NOTE ? ?Preoperative diagnosis:  Recurrent endometriosis, chronic pelvic pain, bilateral ovarian dermoid cysts ? ?Postoperative diagnosis: Recurrent endometriosis of the ovaries, pelvic peritoneum, bilateral ovarian dermoid cysts, left ovarian endometrioma, pelvic adhesions, chronic pelvic pain ? ?Procedure: Laparoscopy, robotic assisted excision and ablation of endometriosis, lysis of adhesions, left salpingo-oophorolysis, right salpingo-oophorectomy, left ovarian dermoid removal, chromotubation ? ? Anesthesia: Gen. endotracheal  ?Surgeon: Governor Specking  ? ?Assistant: Sullivan Lone, RNFA ? ?Complications: None  ? ?Estimated blood loss: Less than 20 mL ? ?Specimens: Posterior cul-de-sac, left ovarian fossa, right ovarian fossa left ureteral peritoneal biopsies, left ovarian adhesion, left ovarian cyst and right tube and ovary to pathology ? ?Findings: On exam under anesthesia, external genitalia, Bartholin's, Skene's, urethra were normal. The vagina was normal. The cervix appeared grossly normal. The uterus was axial mobile and normal size. No posterior cul-de-sac nodularity was palpable. There were no adnexal masses palpable.  The uterus sounded to 8 8 cm. ?On laparoscopy, diaphragm surfaces, liver edge, and gallbladder appeared normal. The appendix appeared grossly normal. ?Anterior cul-de-sac peritoneum was normal.  ?The uterus appeared grossly normal.  Posterior cul-de-sac had numerous nonpigmented lesions of endometriosis with thickening/fibrosis of the peritoneum to the left side. ?The left ovarian fossa had numerous nonpigmented lesions of endometriosis.  The left ovary was adherent with 30% of its surface area and then endometrioma like structure was uncovered during the lysis of adhesions.  In addition there was a 2 x 2 centimeter dermoid that was removed. ?The cohesively adherent area of the left ovary to the ovarian fossa particularly had fibrotic thickening of the peritoneum which was  suspected to contain endometriosis.  Therefore this area was excised completely with careful dissection and staying away from the left ureter. ?The left tube appears slightly splayed over the left ovary but otherwise had healthy fimbria with 4 out of 5 rating, the tube filled with some distention but passed the methylene blue freely.  The proximal end of the left tube also appeared normal. ?The right tube appeared normal proximally and distally and it was patent to chromotubation.   ?The right ovary was enlarged to 6 cm with partially simple cystic and partially to compartments of two 2 cm dermoid right cysts.   ?The right ovary had 30% cohesive adherence to the peritoneal surface of the ovarian fossa.   ?As per long preoperative discussions with the patient the entire right tube and ovary was extirpated. ?The right ovarian fossa had numerous nonpigmented lesions of endometriosis, which were excised after hydrodissection. ? ?Description of the procedure: Patient was placed in lithotomy position and general endotracheal anesthesia was given. 2 g of cefazolin were  given intravenously for prophylaxis. She was prepped and draped in sterile manner. A Foley catheter was inserted into the bladder appeared . A ZUMI uterine manipulator was inserted into the uterus for uterine manipulation and chromotubation. The uterus sounded to 8 cm.   ?An operative field was created on the abdomen and the surgeon was regloved. After preemptive anesthesia with quarter percent bupivacaine, and infraumbilical skin incision was made. A Verress needle was inserted and pneumoperitoneum was created with carbon dioxide. An 8 mm trocar was placed at this incision. Robotic laparoscope with 3-D camera was inserted and, under direct visualization, 2 left mid abdominal 12m incisions were made at the previous scars  and corresponding robotic trochar and an assisting port were placed. On the right side a second  robotic trocar was placed at the  previous scar. The dAT&TXI robot was  docked to the patient after placing her in Trendelenburg position. The surgeon continued the rest of the procedure from the surgical console.  ?Chromotubation showed right tubal patency and left tubal patency although the left tube showed some distention before complete emptying. ?First the filmy adhesions along the mesentery of the sigmoid colon and then the cohesive adhesions involving both the right ovary and the left ovary and the respective ovarian fossae were carefully freed up. ?On the left side this resulted in exposure of the noncystic endometrioma like structure on the left ovary which was drained and 2 cm left endometrioma which was removed. ?Next the cluster of nonpigmented lesions in the left ovarian fossa, over the course of the left ureter, in the posterior cul-de-sac (the left aspect and the right aspect) and the right ovarian fossa were carefully hydrodissected and then excised with cutting current on the monopolar robotic scissors.  Occasional solitary lesions of nonpigmented endometriosis were vaporized using cutting current on the scissors tip. ?Next we performed the right salpingo-oophorectomy in the following manner: The right ureter was identified and the adnexa was brought under traction medially and bipolar coagulation was performed on the infundibulopelvic ligament staying away from the ureter.  Then the left uterotubal junction and the utero-ovarian ligament were separately coagulated cut with bipolar forceps.  Next the mesovarium and the mesosalpinx were successively bipolar coagulated and then cut until the right tube and ovary were extirpated.  All of the specimens were placed in a Endo Catch bag.  The Endo Catch bag was removed through the infraumbilical horizontal incision after extending it. ?Pelvis was copiously irrigated and final hemostasis was insured with the tip of the monopolar scissors using cutting and sometimes coagulating current.  Good hemostasis was insured. ?Pelvis was copiously irrigated with lactated Ringer solution and a slurry of Seprafilm (2 sheets in 40 mL of lactated Ringer solution) was instilled into the pelvis as an adhesion barrier. The fascia of the umbilical incision was closed with 0 Vicryl continuous suture using a UR 6 needle.  The skin incisions were closed with 4-0 Monocryl subcuticular stitches.  ?At this point the procedure was terminated. Instrument and lap pad count was correct.  ? ?The patient tolerated the procedure well and was transferred to recovery room in satisfactory condition.   ? ?Governor Specking, MD ? ? ?

## 2022-01-30 NOTE — Anesthesia Procedure Notes (Signed)
Procedure Name: Intubation ?Date/Time: 01/30/2022 1:45 PM ?Performed by: Clearnce Sorrel, CRNA ?Pre-anesthesia Checklist: Patient identified, Emergency Drugs available, Suction available and Patient being monitored ?Patient Re-evaluated:Patient Re-evaluated prior to induction ?Oxygen Delivery Method: Circle System Utilized ?Preoxygenation: Pre-oxygenation with 100% oxygen ?Induction Type: IV induction ?Ventilation: Mask ventilation without difficulty ?Laryngoscope Size: Mac and 3 ?Grade View: Grade I ?Tube type: Oral ?Tube size: 7.0 mm ?Number of attempts: 1 ?Airway Equipment and Method: Stylet and Oral airway ?Placement Confirmation: ETT inserted through vocal cords under direct vision, positive ETCO2 and breath sounds checked- equal and bilateral ?Secured at: 22 cm ?Tube secured with: Tape ?Dental Injury: Teeth and Oropharynx as per pre-operative assessment  ? ? ? ? ?

## 2022-01-30 NOTE — Anesthesia Preprocedure Evaluation (Addendum)
Anesthesia Evaluation  ?Patient identified by MRN, date of birth, ID band ?Patient awake ? ? ? ?Reviewed: ?Allergy & Precautions, NPO status , Patient's Chart, lab work & pertinent test results ? ?History of Anesthesia Complications ?(+) PONV and history of anesthetic complications ? ?Airway ?Mallampati: II ? ?TM Distance: >3 FB ?Neck ROM: Full ? ? ? Dental ?no notable dental hx. ? ?  ?Pulmonary ?neg pulmonary ROS, former smoker,  ?  ?Pulmonary exam normal ?breath sounds clear to auscultation ? ? ? ? ? ? Cardiovascular ?negative cardio ROS ?Normal cardiovascular exam ?Rhythm:Regular Rate:Normal ? ? ?  ?Neuro/Psych ?PSYCHIATRIC DISORDERS Anxiety Depression negative neurological ROS ?   ? GI/Hepatic ?negative GI ROS, Neg liver ROS,   ?Endo/Other  ?negative endocrine ROS ? Renal/GU ?negative Renal ROS  ?negative genitourinary ?  ?Musculoskeletal ?negative musculoskeletal ROS ?(+)  ? Abdominal ?  ?Peds ?negative pediatric ROS ?(+)  Hematology ?negative hematology ROS ?(+)   ?Anesthesia Other Findings ? ? Reproductive/Obstetrics ?negative OB ROS ? ?  ? ? ? ? ? ? ? ? ? ? ? ? ? ?  ?  ? ? ? ? ? ? ? ? ?Anesthesia Physical ?Anesthesia Plan ? ?ASA: 2 ? ?Anesthesia Plan: General  ? ?Post-op Pain Management:   ? ?Induction: Intravenous ? ?PONV Risk Score and Plan: 4 or greater and Scopolamine patch - Pre-op, Treatment may vary due to age or medical condition, Midazolam, Dexamethasone and Ondansetron ? ?Airway Management Planned: Oral ETT ? ?Additional Equipment: None ? ?Intra-op Plan:  ? ?Post-operative Plan: Extubation in OR ? ?Informed Consent: I have reviewed the patients History and Physical, chart, labs and discussed the procedure including the risks, benefits and alternatives for the proposed anesthesia with the patient or authorized representative who has indicated his/her understanding and acceptance.  ? ? ? ?Dental advisory given ? ?Plan Discussed with: CRNA, Anesthesiologist and  Surgeon ? ?Anesthesia Plan Comments:   ? ? ? ? ? ? ?Anesthesia Quick Evaluation ? ?

## 2022-01-30 NOTE — Anesthesia Postprocedure Evaluation (Signed)
Anesthesia Post Note ? ?Patient: Carmen Harris ? ?Procedure(s) Performed: XI ROBOTIC ASSISTED LAPAROSCOPIC LEFT OVARIAN CYSTECTOMY/RIGHT SALPINGOOPHERECTOMY/EXCISION OF ENDOMETRIOSIS/CHROMOPERTUBATION (Bilateral: Abdomen) ? ?  ? ?Patient location during evaluation: PACU ?Anesthesia Type: General ?Level of consciousness: awake and alert ?Pain management: pain level controlled ?Vital Signs Assessment: post-procedure vital signs reviewed and stable ?Respiratory status: spontaneous breathing, nonlabored ventilation and respiratory function stable ?Cardiovascular status: blood pressure returned to baseline and stable ?Postop Assessment: no apparent nausea or vomiting ?Anesthetic complications: no ? ? ?No notable events documented. ? ?Last Vitals:  ?Vitals:  ? 01/30/22 1630 01/30/22 1715  ?BP: 100/83 111/71  ?Pulse: 66 70  ?Resp: 12 16  ?Temp:  36.4 ?C  ?SpO2: 100% 100%  ?  ?Last Pain:  ?Vitals:  ? 01/30/22 1715  ?TempSrc:   ?PainSc: 3   ? ? ?  ?  ?  ?  ?  ?  ? ?Damario Gillie,W. EDMOND ? ? ? ? ?

## 2022-01-30 NOTE — Transfer of Care (Signed)
Immediate Anesthesia Transfer of Care Note ? ?Patient: Carmen Harris ? ?Procedure(s) Performed: XI ROBOTIC ASSISTED LAPAROSCOPIC LEFT OVARIAN CYSTECTOMY/RIGHT SALPINGOOPHERECTOMY/EXCISION OF ENDOMETRIOSIS/CHROMOPERTUBATION (Bilateral: Abdomen) ? ?Patient Location: PACU ? ?Anesthesia Type:General ? ?Level of Consciousness: drowsy ? ?Airway & Oxygen Therapy: Patient Spontanous Breathing ? ?Post-op Assessment: Report given to RN and Post -op Vital signs reviewed and stable ? ?Post vital signs: Reviewed and stable ? ?Last Vitals:  ?Vitals Value Taken Time  ?BP 130/81 01/30/22 1608  ?Temp    ?Pulse 70 01/30/22 1611  ?Resp 16 01/30/22 1611  ?SpO2 99 % 01/30/22 1611  ?Vitals shown include unvalidated device data. ? ?Last Pain:  ?Vitals:  ? 01/30/22 1122  ?TempSrc: Oral  ?PainSc: 0-No pain  ?   ? ?Patients Stated Pain Goal: 2 (01/30/22 1122) ? ?Complications: No notable events documented. ?

## 2022-01-30 NOTE — H&P (Addendum)
?Carmen Harris is a 26 y.o. female , originally referred to me by Dr. Rogue Bussing, for management of recurrent endometriosis with severe pelvic pain.  My last laparoscopic excision of endometriosis and right ovarian cystectomy has been able to improve the patient's quality of life initially in 2017, but lately, patient has been experiencing increasing amounts of lower abdominal pain and left pelvic pain as well as dysmenorrhea.  Symptoms have remained resistance or recurred to some specific suppressive treatment agents (continuous oral contraceptive pills, progestins, letrozole plus progestin, GnRH antagonist) for endometriosis and patient has been reluctant to try other suppressive medications for feared side effects.  Patient would like to preserve her childbearing potential ideally, but more importantly she would like to have her quality of life improved and the frequency with which she has needed laparoscopic extirpation thus far to be reduced.  Therefore she asked that the most painful right adnexa be removed. ? ?Pertinent Gynecological History: ?Menses: flow is excessive with use of 3 pads or tampons on heaviest days ?Bleeding: dysfunctional uterine bleeding ?Contraception: none ?DES exposure: denies ?Blood transfusions: none ?Sexually transmitted diseases: no past history ?Last pap: normal  ? ?Menstrual History: ?Menarche age: 70 ?No LMP recorded. ?  ? ?Past Medical History:  ?Diagnosis Date  ? Acne   ? Anorexia nervosa   ? Depression   ? Endometriosis of pelvic peritoneum   ? GAD (generalized anxiety disorder)   ? History of COVID-19 01/2021  ? per pt mild to moderate symptoms that resolved  ? Ovarian cyst, bilateral   ? PONV (postoperative nausea and vomiting)   ? Seasonal allergic rhinitis   ? inhaler prn  ? Wears contact lenses   ?  ?   ?    ?    ?    ? ?Past Surgical History:  ?Procedure Laterality Date  ? LAPAROSCOPIC OVARIAN CYSTECTOMY Bilateral 2014  ? LAPAROSCOPIC OVARIAN CYSTECTOMY Right 03/03/2016  ?  Procedure: LAPAROSCOPIC RIGHT OVARIAN CYSTECTOMY;  Surgeon: Governor Specking, MD;  Location: [facility];  Service: Gynecology;  Laterality: Right;  ? TONSILLECTOMY  2015  ? ?  ?   ?    ? ?Family History  ?Problem Relation Age of Onset  ? Schizophrenia Paternal Uncle   ? No hereditary disease.  No cancer of breast, ovary, uterus. No cutaneous leiomyomatosis or renal cell carcinoma. ? ?Social History  ? ?Socioeconomic History  ? Marital status: Single  ?  Spouse name: Not on file  ? Number of children: Not on file  ? Years of education: Not on file  ? Highest education level: Not on file  ?Occupational History  ? Not on file  ?Tobacco Use  ? Smoking status: Former  ?  Years: 1.00  ?  Types: Cigarettes  ?  Quit date: 09/03/2015  ?  Years since quitting: 6.4  ? Smokeless tobacco: Never  ?Vaping Use  ? Vaping Use: Former  ?Substance and Sexual Activity  ? Alcohol use: Yes  ?  Alcohol/week: 7.0 - 14.0 standard drinks  ?  Types: 7 - 14 Glasses of wine per week  ?  Comment: 1-2 wine daily  ? Drug use: No  ?  Comment: per pt hx narcotic use and does not want any prescribed  ? Sexual activity: Yes  ?  Birth control/protection: Condom, Abstinence  ?Other Topics Concern  ? Not on file  ?Social History Narrative  ? Not on file  ? ?Social Determinants of Health  ? ?Financial Resource Strain: Not  on file  ?Food Insecurity: Not on file  ?Transportation Needs: Not on file  ?Physical Activity: Not on file  ?Stress: Not on file  ?Social Connections: Not on file  ?Intimate Partner Violence: Not on file  ? ? ?No Known Allergies ? ?No current facility-administered medications on file prior to encounter.  ? ?Current Outpatient Medications on File Prior to Encounter  ?Medication Sig Dispense Refill  ? buPROPion (WELLBUTRIN XL) 300 MG 24 hr tablet TAKE 1 TABLET EVERY DAY (Patient taking differently: 300 mg daily.) 30 tablet 0  ? cetirizine (ZYRTEC) 10 MG tablet Take 10 mg by mouth daily.    ? lisdexamfetamine (VYVANSE) 40 MG  capsule Take 40 mg by mouth every morning.    ? VITAMIN A EX Apply topically 3 (three) times a week. Brand-- [id number]    ? ALBUTEROL IN Inhale into the lungs as needed (per pt for allergies).    ? hydrOXYzine (ATARAX) 10 MG tablet Take 10 mg by mouth 3 (three) times daily as needed.    ? hydrOXYzine (ATARAX) 25 MG tablet Take 25 mg by mouth 3 (three) times daily as needed. Per pt she does have both doseage's    ? ibuprofen (ADVIL) 600 MG tablet Take 600 mg by mouth every 6 (six) hours as needed.    ? tranexamic acid (LYSTEDA) 650 MG TABS tablet Take 1,300 mg by mouth as needed.    ? ?  ?Review of Systems  ?Constitutional: Negative.   ?HENT: Negative.   ?Eyes: Negative.   ?Respiratory: Negative.   ?Cardiovascular: Negative.   ?Gastrointestinal: Negative.   ?Genitourinary: Negative.   ?Musculoskeletal: Negative.   ?Skin: Negative.   ?Neurological: Negative.   ?Endo/Heme/Allergies: Negative.   ?Psychiatric/Behavioral: Negative.   ? ? ? ?Physical Exam  ?BP 135/83   Pulse 86   Temp 98 ?F (36.7 ?C) (Oral)   Resp 14   Ht '5\' 9"'$  (1.753 m)   Wt 66.3 kg   LMP 01/14/2022 (Approximate)   SpO2 97%   BMI 21.58 kg/m?  ?Constitutional: She is oriented to person, place, and time. She appears well-developed and well-nourished.  ?HENT:  ?Head: Normocephalic and atraumatic.  ?Nose: Nose normal.  ?Mouth/Throat: Oropharynx is clear and moist. No oropharyngeal exudate.  ?Eyes: Conjunctivae normal and EOM are normal. Pupils are equal, round, and reactive to light. No scleral icterus.  ?Neck: Normal range of motion. Neck supple. No tracheal deviation present. No thyromegaly present.  ?Cardiovascular: Normal rate.   ?Respiratory: Effort normal and breath sounds normal.  ?GI: Soft. Bowel sounds are normal. She exhibits no distension and no mass. There is no tenderness.  ?Lymphadenopathy:  ?  She has no cervical adenopathy.  ?Neurological: She is alert and oriented to person, place, and time. She has normal reflexes.  ?Skin: Skin is warm.   ?Psychiatric: She has a normal mood and affect. Her behavior is normal. Judgment and thought content normal.  ? ? ? ?Assessment/Plan: ? ?Severe recurrent endometriosis with chronic pelvic pain ?Right ovarian dermoid cyst, as well as endometrioma ? ?Preoperative for robot assisted laparoscopic lysis of adhesions, excision of endometriosis, right ovarian cystectomy, possible right salpingo-oophorectomy ?Benefits and risks of the proposed procedures were discussed with the patient and her family member again.  Bowel prep instructions were given.  All of patient's questions were answered.  She verbalized understanding.  ? ?Governor Specking, MD ?  ?

## 2022-02-03 ENCOUNTER — Encounter (HOSPITAL_BASED_OUTPATIENT_CLINIC_OR_DEPARTMENT_OTHER): Payer: Self-pay | Admitting: Obstetrics and Gynecology

## 2022-02-05 LAB — SURGICAL PATHOLOGY
